# Patient Record
Sex: Male | Born: 1960 | Race: White | Hispanic: No | Marital: Single | Smoking: Never smoker
Health system: Southern US, Community
[De-identification: ages and names within clinical notes are randomized; demographics above are authoritative.]

## PROBLEM LIST (undated history)

## (undated) DIAGNOSIS — E785 Hyperlipidemia, unspecified: Secondary | ICD-10-CM

## (undated) DIAGNOSIS — K219 Gastro-esophageal reflux disease without esophagitis: Secondary | ICD-10-CM

## (undated) DIAGNOSIS — K5792 Diverticulitis of intestine, part unspecified, without perforation or abscess without bleeding: Secondary | ICD-10-CM

## (undated) DIAGNOSIS — I10 Essential (primary) hypertension: Secondary | ICD-10-CM

## (undated) HISTORY — DX: Essential (primary) hypertension: I10

## (undated) HISTORY — DX: Gastro-esophageal reflux disease without esophagitis: K21.9

## (undated) HISTORY — PX: WISDOM TOOTH EXTRACTION: SHX21

## (undated) HISTORY — DX: Diverticulitis of intestine, part unspecified, without perforation or abscess without bleeding: K57.92

## (undated) HISTORY — DX: Hyperlipidemia, unspecified: E78.5

## (undated) HISTORY — PX: OTHER SURGICAL HISTORY: SHX169

---

## 2009-04-17 ENCOUNTER — Ambulatory Visit: Payer: Self-pay | Admitting: Family Medicine

## 2009-04-17 DIAGNOSIS — E785 Hyperlipidemia, unspecified: Secondary | ICD-10-CM

## 2009-04-17 DIAGNOSIS — M2020 Hallux rigidus, unspecified foot: Secondary | ICD-10-CM | POA: Insufficient documentation

## 2009-04-17 DIAGNOSIS — E781 Pure hyperglyceridemia: Secondary | ICD-10-CM

## 2009-04-17 DIAGNOSIS — B0229 Other postherpetic nervous system involvement: Secondary | ICD-10-CM | POA: Insufficient documentation

## 2009-04-21 LAB — CONVERTED CEMR LAB
ALT: 26 units/L (ref 0–53)
Albumin: 4.3 g/dL (ref 3.5–5.2)
Alkaline Phosphatase: 48 units/L (ref 39–117)
Bilirubin, Direct: 0 mg/dL (ref 0.0–0.3)
CO2: 32 meq/L (ref 19–32)
Calcium: 9.4 mg/dL (ref 8.4–10.5)
Chloride: 107 meq/L (ref 96–112)
Direct LDL: 102.9 mg/dL
Glucose, Bld: 93 mg/dL (ref 70–99)
HDL: 35.3 mg/dL — ABNORMAL LOW (ref >39.00)
Sodium: 144 meq/L (ref 135–145)
Total Protein: 7.8 g/dL (ref 6.0–8.3)

## 2009-06-15 ENCOUNTER — Ambulatory Visit: Payer: Self-pay | Admitting: Family Medicine

## 2009-06-15 DIAGNOSIS — M533 Sacrococcygeal disorders, not elsewhere classified: Secondary | ICD-10-CM

## 2009-06-15 DIAGNOSIS — D239 Other benign neoplasm of skin, unspecified: Secondary | ICD-10-CM | POA: Insufficient documentation

## 2009-06-15 DIAGNOSIS — R1032 Left lower quadrant pain: Secondary | ICD-10-CM | POA: Insufficient documentation

## 2009-06-15 LAB — CONVERTED CEMR LAB
Bilirubin Urine: NEGATIVE
Glucose, Urine, Semiquant: NEGATIVE
Ketones, urine, test strip: NEGATIVE
Protein, U semiquant: NEGATIVE
Urobilinogen, UA: 0.2
WBC Urine, dipstick: NEGATIVE

## 2009-06-16 ENCOUNTER — Encounter: Payer: Self-pay | Admitting: Family Medicine

## 2009-06-18 ENCOUNTER — Telehealth: Payer: Self-pay | Admitting: Family Medicine

## 2009-07-09 ENCOUNTER — Ambulatory Visit: Payer: Self-pay | Admitting: Family Medicine

## 2009-07-09 DIAGNOSIS — R519 Headache, unspecified: Secondary | ICD-10-CM | POA: Insufficient documentation

## 2009-07-09 DIAGNOSIS — R51 Headache: Secondary | ICD-10-CM

## 2009-11-17 ENCOUNTER — Ambulatory Visit: Payer: Self-pay | Admitting: Internal Medicine

## 2009-11-17 DIAGNOSIS — L570 Actinic keratosis: Secondary | ICD-10-CM | POA: Insufficient documentation

## 2009-11-19 LAB — CONVERTED CEMR LAB
ALT: 27 units/L (ref 0–53)
AST: 22 units/L (ref 0–37)
Alkaline Phosphatase: 46 units/L (ref 39–117)
BUN: 14 mg/dL (ref 6–23)
Basophils Absolute: 0.1 10*3/uL (ref 0.0–0.1)
CO2: 33 meq/L — ABNORMAL HIGH (ref 19–32)
Chloride: 103 meq/L (ref 96–112)
Eosinophils Relative: 2.3 % (ref 0.0–5.0)
GFR calc non Af Amer: 69.2 mL/min (ref >60)
HCT: 47.1 % (ref 39.0–52.0)
Hemoglobin: 15.8 g/dL (ref 13.0–17.0)
Lymphocytes Relative: 44.4 % (ref 12.0–46.0)
Lymphs Abs: 2.1 10*3/uL (ref 0.7–4.0)
Monocytes Relative: 9.9 % (ref 3.0–12.0)
Neutro Abs: 2 10*3/uL (ref 1.4–7.7)
Phosphorus: 4.7 mg/dL — ABNORMAL HIGH (ref 2.3–4.6)
Potassium: 4.2 meq/L (ref 3.5–5.1)
RDW: 12.5 % (ref 11.5–14.6)
Sodium: 140 meq/L (ref 135–145)
TSH: 1.12 microintl units/mL (ref 0.35–5.50)
Total Bilirubin: 0.5 mg/dL (ref 0.3–1.2)
WBC: 4.8 10*3/uL (ref 4.5–10.5)

## 2009-11-26 ENCOUNTER — Emergency Department (HOSPITAL_COMMUNITY): Admission: EM | Admit: 2009-11-26 | Discharge: 2009-11-27 | Payer: Self-pay | Admitting: Emergency Medicine

## 2009-11-30 ENCOUNTER — Ambulatory Visit: Payer: Self-pay | Admitting: Family Medicine

## 2009-11-30 DIAGNOSIS — K219 Gastro-esophageal reflux disease without esophagitis: Secondary | ICD-10-CM

## 2009-11-30 DIAGNOSIS — M62838 Other muscle spasm: Secondary | ICD-10-CM

## 2009-12-17 ENCOUNTER — Ambulatory Visit: Payer: Self-pay | Admitting: Family Medicine

## 2010-01-11 ENCOUNTER — Ambulatory Visit: Payer: Self-pay | Admitting: Family Medicine

## 2010-03-19 ENCOUNTER — Ambulatory Visit: Payer: Self-pay | Admitting: Family Medicine

## 2010-03-26 ENCOUNTER — Ambulatory Visit: Payer: Self-pay | Admitting: Family Medicine

## 2010-03-26 DIAGNOSIS — M26629 Arthralgia of temporomandibular joint, unspecified side: Secondary | ICD-10-CM

## 2010-05-13 ENCOUNTER — Ambulatory Visit: Payer: Self-pay | Admitting: Family Medicine

## 2010-05-13 DIAGNOSIS — R110 Nausea: Secondary | ICD-10-CM | POA: Insufficient documentation

## 2010-05-14 ENCOUNTER — Encounter: Payer: Self-pay | Admitting: Family Medicine

## 2010-05-17 LAB — CONVERTED CEMR LAB
BUN: 11 mg/dL (ref 6–23)
Basophils Absolute: 0 10*3/uL (ref 0.0–0.1)
Basophils Relative: 0.9 % (ref 0.0–3.0)
Calcium: 9.6 mg/dL (ref 8.4–10.5)
Creatinine, Ser: 1.1 mg/dL (ref 0.4–1.5)
Eosinophils Absolute: 0.2 10*3/uL (ref 0.0–0.7)
GFR calc non Af Amer: 77.16 mL/min (ref >60)
Hemoglobin: 15.6 g/dL (ref 13.0–17.0)
Lipase: 46 units/L (ref 11.0–59.0)
MCHC: 35 g/dL (ref 30.0–36.0)
MCV: 91.1 fL (ref 78.0–100.0)
Monocytes Absolute: 0.5 10*3/uL (ref 0.1–1.0)
Neutro Abs: 1.7 10*3/uL (ref 1.4–7.7)
RBC: 4.89 M/uL (ref 4.22–5.81)
RDW: 13.9 % (ref 11.5–14.6)
TSH: 0.97 microintl units/mL (ref 0.35–5.50)
Total Bilirubin: 0.5 mg/dL (ref 0.3–1.2)

## 2010-05-25 ENCOUNTER — Encounter: Admission: RE | Admit: 2010-05-25 | Discharge: 2010-05-25 | Payer: Self-pay | Admitting: Family Medicine

## 2010-08-19 ENCOUNTER — Ambulatory Visit: Payer: Self-pay | Admitting: Family Medicine

## 2010-08-19 DIAGNOSIS — M5430 Sciatica, unspecified side: Secondary | ICD-10-CM

## 2010-08-19 DIAGNOSIS — K5732 Diverticulitis of large intestine without perforation or abscess without bleeding: Secondary | ICD-10-CM

## 2010-09-23 ENCOUNTER — Other Ambulatory Visit: Payer: Self-pay | Admitting: Family Medicine

## 2010-09-23 ENCOUNTER — Ambulatory Visit
Admission: RE | Admit: 2010-09-23 | Discharge: 2010-09-23 | Payer: Self-pay | Source: Home / Self Care | Attending: Family Medicine | Admitting: Family Medicine

## 2010-09-23 ENCOUNTER — Telehealth (INDEPENDENT_AMBULATORY_CARE_PROVIDER_SITE_OTHER): Payer: Self-pay | Admitting: *Deleted

## 2010-09-23 LAB — HEPATIC FUNCTION PANEL
ALT: 32 U/L (ref 0–53)
Alkaline Phosphatase: 49 U/L (ref 39–117)
Bilirubin, Direct: 0.1 mg/dL (ref 0.0–0.3)
Total Bilirubin: 0.6 mg/dL (ref 0.3–1.2)
Total Protein: 7.2 g/dL (ref 6.0–8.3)

## 2010-09-23 LAB — LIPID PANEL: VLDL: 93.2 mg/dL — ABNORMAL HIGH (ref 0.0–40.0)

## 2010-09-23 LAB — LDL CHOLESTEROL, DIRECT: Direct LDL: 95.5 mg/dL

## 2010-09-28 NOTE — Assessment & Plan Note (Signed)
Summary: F/U [facility] 11/26/09/CLE   Vital Signs:  Patient profile:   50 year old male Height:      66 inches Weight:      181.13 pounds BMI:     29.34 Temp:     98.0 degrees F oral Pulse rate:   64 / minute Pulse rhythm:   regular BP sitting:   100 / 72  (left arm) Cuff size:   regular  Vitals Entered By: Linde Gillis CMA Duncan Dull) (November 30, 2009 8:02 AM)  CC: follow-up visit from urgent care   History of Present Illness: 50 year old:  Woke up early thursday morning and was having some pain and continued to get worse, and some pain with coughing.   Had an EKG, Chest xray -- no pain with pressing and palpation. Dukes MM did not help at all. Had probably a d-dimer and cardiac enzymes that were negative.   Stopped and got a donut -- swallowing was hurtng some.   R scapular stabilizers   2. GERD, improved with prevacid, failed pepcid. better last few days  ROS: as above, no fever, chills, sweats.  GEN: WDWN, NAD, Non-toxic, A & O x 3 HEENT: Atraumatic, Normocephalic. Neck supple. No masses, No LAD. Ears and Nose: No external deformity. CV: RRR, No M/G/R. No JVD. No thrill. No extra heart sounds. PULM: CTA B, no wheezes, crackles, rhonchi. No retractions. No resp. distress. No accessory muscle use. MSK: TTP at deep scapular stabilizers EXTR: No c/c/e NEURO: Normal gait.  PSYCH: Normally interactive. Conversant. Not depressed or anxious appearing.  Calm demeanor.    Allergies: 1)  ! Levaquin   Impression & Recommendations:  Problem # 1:  MUSCLE SPASM, TRAPEZIUS MUSCLE, RIGHT (ICD-728.85) Assessment New R scap stabilizers improving reviewed basic rom and rehab  Problem # 2:  GERD (ICD-530.81) c/w prevacid  Complete Medication List: 1)  Hydrochlorothiazide 25 Mg Tabs (Hydrochlorothiazide) .... Takes 1 a day 2)  Crestor 10 Mg Tabs (Rosuvastatin calcium) .Marland Kitchen.. 1 tab at bedtime  Current Allergies (reviewed today): ! LEVAQUIN

## 2010-09-28 NOTE — Assessment & Plan Note (Signed)
Summary: 11:45 HA,NAUSEA/CLE   Vital Signs:  Patient profile:   50 year old male Weight:      180 pounds Temp:     98.5 degrees F oral Pulse rate:   72 / minute Pulse rhythm:   regular BP sitting:   110 / 80  (left arm) Cuff size:   large  Vitals Entered By: Mervin Hack CMA Duncan Dull) (November 17, 2009 12:03 PM) CC: nausea   History of Present Illness: Having nausea for about 4 days Intermittent---persists even after eating  tried ginger ale, pepto bismol---no help Very gassy--despite holding off on fish oil  No abdominal pain No vomiting no diarrhea appetite is okay No apparent fever  slight headache---dull during day (this is a chronic problem he relates to clenching teeth at night)  New lesion on left cheek also mole on right knee  Allergies: 1)  ! Levaquin  Past History:  Past medical, surgical, family and social histories (including risk factors) reviewed for relevance to current acute and chronic problems.  Past Medical History: Reviewed history from 04/17/2009 and no changes required. blood in stool diverticulitis frequent headaches GERD allergies high blood pressure readings high cholesterol migraines  Past Surgical History: Reviewed history from 04/17/2009 and no changes required. none  Family History: Reviewed history from 04/17/2009 and no changes required. Family History of Arthritis Family History Breast cancer 1st degree relative <50 Family History Hypertension Family History Lung cancer Family History of Stroke F 1st degree relative <60 Family History of Stroke M 1st degree relative <50  Social History: Reviewed history from 04/17/2009 and no changes required. Occupation: Scientist, physiological Single Never Smoked Alcohol use-yes Drug use-no Regular exercise-no  Review of Systems       No history of ulcer No black stool or blood in stool (except rarely that he relates to diverticulosis) Lives alone--gas heat. No carbon  monoxide monitor  Gets anxious in planes--travels a lot wonders about OTC meds to take uses dramamine---discussed ?meclizine  Physical Exam  General:  alert and normal appearance.   Eyes:  pupils equal, pupils round, pupils reactive to light, and no optic disk abnormalities.   Neck:  supple, no masses, no thyromegaly, and no cervical lymphadenopathy.   Lungs:  normal respiratory effort and normal breath sounds.   Heart:  normal rate, regular rhythm, no murmur, and no gallop.   Abdomen:  soft, non-tender, normal bowel sounds, no distention, and no masses.   Extremities:  no edema Neurologic:  alert & oriented X3, cranial nerves II-XII intact, strength normal in all extremities, and gait normal.   Skin:  actinic left cheek 6mm symmetric nevus on right thigh Psych:  normally interactive, good eye contact, not anxious appearing, and not depressed appearing.     Impression & Recommendations:  Problem # 1:  NAUSEA (ICD-787.02) Assessment New  unclear etiology neuro exam and GI both negative history not revealing  will get CO monitor try meclizine as needed  check labs  Orders: Venipuncture (47829) TLB-Renal Function Panel (80069-RENAL) TLB-CBC Platelet - w/Differential (85025-CBCD) TLB-Hepatic/Liver Function Pnl (80076-HEPATIC) TLB-TSH (Thyroid Stimulating Hormone) (84443-TSH) TLB-Amylase (82150-AMYL) TLB-Lipase (83690-LIPASE)  Problem # 2:  ACTINIC KERATOSIS (ICD-702.0) Assessment: New  treated 40 seconds x 2 tolerated well discussed care at home  Orders: Cryotherapy/Destruction benign or premalignant lesion (1st lesion)  (17000)  Complete Medication List: 1)  Hydrochlorothiazide 25 Mg Tabs (Hydrochlorothiazide) .... Takes 1 a day 2)  Crestor 10 Mg Tabs (Rosuvastatin calcium) .Marland Kitchen.. 1 tab at bedtime 3)  Fish  Oil Maximum Strength 1200 Mg Caps (Omega-3 fatty acids) .Marland Kitchen.. 1 tab a day  Patient Instructions: 1)  Please schedule physical with Dr Patsy Lager at your  convenience 2)  Call next week for reevaluation if the nausea persists   Current Allergies (reviewed today): ! LEVAQUIN

## 2010-09-28 NOTE — Assessment & Plan Note (Signed)
Summary: HA,NAUSEA/CLE   Vital Signs:  Patient profile:   50 year old male Height:      66 inches Weight:      187.0 pounds BMI:     30.29 Temp:     98.5 degrees F oral Pulse rate:   84 / minute Pulse rhythm:   regular BP sitting:   110 / 78  (left arm) Cuff size:   regular  Vitals Entered By: Benny Lennert CMA Duncan Dull) (May 13, 2010 9:02 AM)  History of Present Illness: Chief complaint nausea  Seen 7/22 for headaches and blurred vision         HA noted by patient.  No relationship between HA and vision change per patient.  Frontal HA, happening over last few weeks.  Lasting 1-4 hours.  B  forehead.  No F/C.  No focal neuro changes with HA.  H/o migraines in teen years.  Constant pain, not throbbing.  No vision changes during HA.  No aura with these HA.  +photo but no phonophobia.  Caffeine in soda at work.       Dr. Para March recommended...taper NSAIDS and caffeine and follow up as needed.  If any vision changes, then follow up with eye clinic.   Seen 7/29 for headache and possible TMJ     Dr. Patsy Lager referred him to dentist for mouth guard.    HAs not seen dentist yet.   TODAY.Marland KitchenMarland KitchenIntermittant nausea...x months No particular time of day...occurs more when he has not eaten.  No emesis. Not associated with headaches... No abdominal pain. No diarrhea, no constipation. Occ heartburn at night...started on pantoprazole....no further issues, made diet changes as well.  several weeks ago noted brbpr...lasted 2-3 days...has known diverticular disease.Typical for him.  Headaches...eye strain during the day...given occupational glasses...has helped with headhaches.  Last colonoscopy 8 years ago.   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 Takes 1 A Day 2)  Crestor 10 Mg Tabs (Rosuvastatin Calcium) .Marland Kitchen.. 1 Tab At Bedtime 3)  Pantoprazole Sodium 40 Mg Tbec (Pantoprazole Sodium) .Marland Kitchen.. 1 By Mouth Daily 4)  Multivitamins   Tabs (Multiple Vitamin) .... Take 1 Tablet By Mouth Once A Day  Allergies: 1)  ! Levaquin  Past History:  Past medical, surgical, family and social histories (including risk factors) reviewed, and no changes noted (except as noted below).  Past Medical History: Reviewed history from 04/17/2009 and no changes required. blood in stool diverticulitis frequent headaches GERD allergies high blood pressure readings high cholesterol migraines  Past Surgical History: Reviewed history from 04/17/2009 and no changes required. none  Family History: Reviewed history from 04/17/2009 and no changes required. Family History of Arthritis Family History Breast cancer 1st degree relative <50 Family History Hypertension Family History Lung cancer Family History of Stroke F 1st degree relative <60 Family History of Stroke  M 1st degree relative <50  Social History: Reviewed history from 03/19/2010 and no changes required. Occupation: Scientist, physiological for quality team.   Single Never Smoked Alcohol use-yes Drug use-no Regular exercise-no From [place].   Review of Systems General:  Denies fatigue and fever. CV:  Denies chest pain or discomfort. Resp:  Denies shortness of breath. GI:  Complains of bloody stools; denies constipation and  diarrhea. GU:  Denies dysuria. Neuro:  Denies difficulty with concentration, disturbances in coordination, falling down, numbness, poor balance, tingling, tremors, visual disturbances, and weakness.  Physical Exam  General:  overweight male inNAD  Mouth:  MMM Neck:  no carotid bruit or thyromegaly no cervical or supraclavicular lymphadenopathy  Lungs:  Normal respiratory effort, chest expands symmetrically. Lungs are clear to auscultation, no crackles or wheezes. Heart:  Normal rate and regular rhythm. S1 and S2 normal without gallop, murmur, click, rub or other extra sounds. Abdomen:  Bowel sounds positive,abdomen soft and non-tender without masses, organomegaly or hernias noted. Pulses:  R and L posterior tibial pulses are full and equal bilaterally  Extremities:  no edema    Impression & Recommendations:  Problem # 1:  NAUSEA (ICD-787.02) Not associated with headahce (headaches seem to be more eye strain and TMJ, less migraine like) Will eval with labs. No clear abdominal pain...possible inadequately controlled GERD/gastritis....will check Hpylori in stool. No clear infectious component. If labs negative...consider atypical presentation of gallbladder disease and consider [place] abd.  Orders: TLB-CBC Platelet - w/Differential (85025-CBCD) TLB-BMP (Basic Metabolic Panel-BMET) (80048-METABOL) TLB-Hepatic/Liver Function Pnl (80076-HEPATIC) TLB-TSH (Thyroid Stimulating Hormone) (84443-TSH) TLB-Lipase (83690-LIPASE) T- * Misc. Laboratory test [phone])  Complete Medication List: 1)  Hydrochlorothiazide 25 Mg Tabs (Hydrochlorothiazide) .... Takes 1 a day 2)  Crestor 10 Mg Tabs (Rosuvastatin calcium) .Marland Kitchen.. 1 tab at bedtime 3)  Pantoprazole Sodium 40 Mg Tbec (Pantoprazole sodium) .Marland Kitchen.. 1 by mouth daily 4)  Multivitamins Tabs (Multiple vitamin) .... Take 1 tablet by mouth once a day  Patient Instructions: 1)  Go to ER if severe abdominal pain. call if not tolerating liquids, uncontrollable  vomiting. 2)   We will call with lab results.   Current Allergies (reviewed today): ! LEVAQUIN

## 2010-09-28 NOTE — Assessment & Plan Note (Signed)
Summary: ACID REFLUX/CLE   Vital Signs:  Patient profile:   50 year old male Weight:      181 pounds Temp:     98.6 degrees F oral Pulse rate:   68 / minute Pulse rhythm:   regular BP sitting:   122 / 82  (right arm) Cuff size:   regular  Vitals Entered By: Lowella Petties CMA (Jan 11, 2010 4:02 PM) CC: Indegestion   History of Present Illness: 50 year old male:  Heartburn and pain i nthe chest difficult and ache in the jaw and  waking up at night  prop up on   taking some maalox at night.  [place]  up to three times a week.feels some heartburn sensation   Has had a EGD and colonoscopy Took some Nexium in the past  prevacid  nexium prilosec pepcid zantac   Allergies: 1)  ! Levaquin  Past History:  Past medical, surgical, family and social histories (including risk factors) reviewed, and no changes noted (except as noted below).  Past Medical History: Reviewed history from 04/17/2009 and no changes required. blood in stool diverticulitis frequent headaches GERD allergies high blood pressure readings high cholesterol migraines  Past Surgical History: Reviewed history from 04/17/2009 and no changes required. none  Family History: Reviewed history from 04/17/2009 and no changes required. Family History of Arthritis Family History Breast cancer 1st degree relative <50 Family History Hypertension Family History Lung cancer Family History of Stroke F 1st degree relative <60 Family History of Stroke M 1st degree relative <50  Social History: Reviewed history from 04/17/2009 and no changes required. 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 GI:  See HPI; Denies bloody stools, change in bowel habits, and constipation.  Physical Exam  General:  GEN: WDWN, NAD, Non-toxic, A & O x 3 HEENT: Atraumatic,  Normocephalic. Neck supple. No masses, No LAD. Ears and Nose: No external deformity. CV: RRR, No M/G/R. No JVD. No thrill. No extra heart sounds. ABD: S, NT, ND, +BS. No rebound tenderness. No HSM.  EXTR: No c/c/e NEURO: Normal gait.  PSYCH: Normally interactive. Conversant. Not depressed or anxious appearing.  Calm demeanor.     Impression & Recommendations:  Problem # 1:  GERD (ICD-530.81) Assessment Deteriorated  His updated medication list for this problem includes:    Pantoprazole Sodium 40 Mg Tbec (Pantoprazole sodium) .Marland Kitchen... 1 by mouth daily  Diagnostics Reviewed:  Discussed lifestyle modifications, diet, antacids/medications, and preventive measures.  Complete Medication List: 1)  Hydrochlorothiazide 25 Mg Tabs (Hydrochlorothiazide) .... Takes 1 a day 2)  Crestor 10 Mg Tabs (Rosuvastatin calcium) .Marland Kitchen.. 1 tab at bedtime 3)  Pantoprazole Sodium 40 Mg Tbec (Pantoprazole sodium) .Marland Kitchen.. 1 by mouth daily Prescriptions: PANTOPRAZOLE SODIUM 40 MG TBEC (PANTOPRAZOLE SODIUM) 1 by mouth daily  #30 x 11   Entered and Authorized by:   Hannah Beat MD   Signed by:   Hannah Beat MD on 01/11/2010   Method used:   [facility] to        [facility]  [address] (retail)       [address]       [place], [place]  [address]       Ph: [phone] or [phone]       Fax: [phone]   RxID:   [id number]   Prior Medications: HYDROCHLOROTHIAZIDE 25 MG TABS (HYDROCHLOROTHIAZIDE) takes 1 a day CRESTOR  10 MG TABS (ROSUVASTATIN CALCIUM) 1 tab at bedtime Current Allergies: ! LEVAQUIN

## 2010-09-28 NOTE — Assessment & Plan Note (Signed)
Summary: HEADACHES, BLURRED VISION   Vital Signs:  Patient profile:   50 year old male Height:      66 inches Weight:      188 pounds BMI:     30.45 Temp:     98.3 degrees F oral Pulse rate:   84 / minute Pulse rhythm:   regular BP sitting:   112 / 86  (left arm) Cuff size:   regular  Vitals Entered By: Delilah Shan CMA Aryaman [place] Dull) (March 19, 2010 12:23 PM) CC: Headaches with brief blurred vision on 2 occasions.   History of Present Illness: HA noted by patient.  No relationship between HA and vision change per patient.  Frontal HA, happening over last few weeks.  Lasting 1-4 hours.  B  forehead.  No F/C.  No focal neuro changes with HA.  H/o migraines in teen years.  Constant pain, not throbbing.  No vision changes during HA.  No aura with these HA.  +photo but no phonophobia.  Caffeine in soda at work.    2 episodes with blurry vision at work, lasted most of the day and then passed. No field loss per patient.  Normally wears glasses.  "My eyes hurt when I'm leaving work, late in the evening."  Sig computer time.    Occ nausea, sometimes in the middle of a meal.  Belching helps.  Has been taking frequent ibuprofen doses for back strain which is improving.     Allergies: 1)  ! Levaquin  Past History:  Past Medical History: Last updated: 04/17/2009 blood in stool diverticulitis frequent headaches GERD allergies high blood pressure readings high cholesterol migraines  Social History: Last updated: 03/19/2010 Occupation: Scientist, physiological for quality team.   Single Never Smoked Alcohol use-yes Drug use-no Regular exercise-no From [place].   Social History: Occupation: Scientist, physiological for quality team.   Single Never Smoked Alcohol use-yes Drug use-no Regular exercise-no From [place].   Review of Systems       See HPI.  Otherwise noncontributory.    Physical Exam  General:  GEN: nad, alert and oriented HEENT: mucous membranes moist,  tm wnl, PERRL, EOMI, fundus wnl bilaterally, nasal epithelium wnl, not tender to palpation on max/frontal sinuses bilaterally  NECK: supple w/o LA CV: rrr.  no murmur PULM: ctab, no inc wob ABD: soft, +bs EXT: no edema SKIN: no acute rash  CN 2-12 wnl B, S/S/DTR wnl x4, cerebellar fxn wnl bilaterally    Impression & Recommendations:  Problem # 1:  HEADACHE (ICD-784.0) Unclear source.  KGM:WNUUV rebound, migraine, chronical daily HA, caffeine, eye strain.  Reassuring exam and no focal deficit to direct imaging at this point.  I would have patient taper NSAIDS and caffeine and follow up as needed.  If any vision changes, then follow up with eye clinic.  He agrees.    Complete Medication List: 1)  Hydrochlorothiazide 25 Mg Tabs (Hydrochlorothiazide) .... Takes 1 a day 2)  Crestor 10 Mg Tabs (Rosuvastatin calcium) .Marland Kitchen.. 1 tab at bedtime 3)  Pantoprazole Sodium 40 Mg Tbec (Pantoprazole sodium) .Marland Kitchen.. 1 by mouth daily 4)  Multivitamins Tabs (Multiple vitamin) .... Take 1 tablet by mouth once a day  Patient Instructions: 1)  Work on decreasing the caffeine and ibuprofen.  Let us know if doesn't get better.  2)  Please schedule a follow-up appointment as needed .  Prescriptions: HYDROCHLOROTHIAZIDE 25 MG TABS (HYDROCHLOROTHIAZIDE) takes 1 a day  #30 x 6   Entered by:   Laurette Schimke  [place] CMA (AAMA)   Authorized by:   Crawford Givens MD   Signed by:   Delilah Shan CMA (AAMA) on 03/19/2010   Method used:   [facility]        [facility]  [address] [facility]       [address]       [place], [place]  [address]       Ph: [phone] or [phone]       Fax: [phone]   RxID:   [id number]   Current Allergies (reviewed today): ! LEVAQUIN

## 2010-09-28 NOTE — Assessment & Plan Note (Signed)
Summary: HA,JAW PAIN FROM CLENCHING TEETH WHILE SLEEPING   Vital Signs:  Patient profile:   50 year old male Height:      66 inches Weight:      188.0 pounds BMI:     30.45 Temp:     98.6 degrees F oral Pulse rate:   84 / minute Pulse rhythm:   regular BP sitting:   110 / 70  (left arm) Cuff size:   regular  Vitals Entered By: Benny Lennert CMA Duncan Dull) (March 26, 2010 8:02 AM)  History of Present Illness: Chief complaint Headache,jaw pain from clenching teeth while sleeping  50 year old male:  Jaw is really asore in the morning.  Can see her clenching   jaw pain and HA in the morning  HA noted last exam, will get off and on, thinks script may be off for glasses  GEN: Well-developed,well-nourished,in no acute distress; alert,appropriate and cooperative throughout examination HEENT: Normocephalic and atraumatic without obvious abnormalities. No apparent alopecia or balding. Ears, externally no deformities PULM: Breathing comfortably in no respiratory distress EXT: No clubbing, cyanosis, or edema PSYCH: Normally interactive. Cooperative during the interview. Pleasant. Friendly and conversant. Not anxious or depressed appearing. Normal, full affect.   Allergies: 1)  ! Levaquin   Impression & Recommendations:  Problem # 1:  TMJ PAIN (ICD-524.62) I am going to have him see Dentist to discuss, ? mouth guard  Complete Medication List: 1)  Hydrochlorothiazide 25 Mg Tabs (Hydrochlorothiazide) .... Takes 1 a day 2)  Crestor 10 Mg Tabs (Rosuvastatin calcium) .Marland Kitchen.. 1 tab at bedtime 3)  Pantoprazole Sodium 40 Mg Tbec (Pantoprazole sodium) .Marland Kitchen.. 1 by mouth daily 4)  Multivitamins Tabs (Multiple vitamin) .... Take 1 tablet by mouth once a day  Patient Instructions: 1)  DR. CLARK OR DR. Helmut Muster IN [place] [address]   Current Allergies (reviewed today): ! LEVAQUIN

## 2010-09-30 NOTE — Progress Notes (Signed)
----   Converted from flag ---- ---- 09/22/2010 5:01 PM, Hannah Beat MD wrote: FLP: 272.4 HFP: v58.69 PSA: v76.44  ---- 09/22/2010 4:04 PM, Mills Koller wrote: patient has a lab appt for tomorrow, what labs w/ dx, please. Terri ------------------------------

## 2010-09-30 NOTE — Assessment & Plan Note (Signed)
Summary: LOW BACK PAIN,LEFT LEG/CLE   Vital Signs:  Patient profile:   50 year old male Height:      66 inches Weight:      192.50 pounds BMI:     31.18 Temp:     98 degrees F oral Pulse rate:   80 / minute Pulse rhythm:   regular BP sitting:   112 / 74  (left arm) Cuff size:   regular  Vitals Entered By: Benny Lennert CMA Duncan Dull) (August 19, 2010 3:12 PM)  History of Present Illness: Chief complaint low back pain and left leg pain  50 year old male:  Abdominal pain when haivng a bowel movement. LLQ pain. Right in the beltline.   Back has bene oingoing for the past couple of days. Has had some issues with back for about ten years or more, now with radiculaopthy on the left side.         This is a 50 year old male who presents with diverticular disease.  The patient complains of abdominal pain, but denies nausea, vomiting, diarrhea, constipation, jaundice, melena, and hematochezia.  The patient notes diverticulosis.  Location: descending.  Previous management for this problem includes medical treatment as outpatient.    Back Pain History:      The patient's back pain started approximately 08/17/2010.  The pain is located in the lower back region and does radiate below the knees.  He states that he has had a prior history of back pain.  The patient has not had any recent physical therapy for his back pain.    Critical Exclusionary Diagnosis Criteria (CEDC) for Back Pain:      The patient denies a history of previous trauma.  He has no prior history of spinal surgery.  There are no symptoms to suggest infection, cancer, cauda equina, or psychosocial factors for back pain.    Allergies: 1)  ! Levaquin  Past History:  Past medical, surgical, family and social histories (including risk factors) reviewed, and no changes noted (except as noted below).  Past Medical History: Reviewed history from 04/17/2009 and no changes required. blood in stool diverticulitis frequent  headaches GERD allergies high blood pressure readings high cholesterol migraines  Past Surgical History: Reviewed history from 05/26/2010 and no changes required. none  u/s, 04/2010 -prob hemangioma,  repeat, 6 mo needed (10/2010)  Family History: Reviewed history from 04/17/2009 and no changes required. Family History of Arthritis Family History Breast cancer 1st degree relative <50 Family History Hypertension Family History Lung cancer Family History of Stroke F 1st degree relative <60 Family History of Stroke M 1st degree relative <50  Social History: Reviewed history from 03/19/2010 and no changes required. Occupation: Scientist, physiological for quality team.   Single Never Smoked Alcohol use-yes Drug use-no Regular exercise-no From [place].   Review of Systems       REVIEW OF SYSTEMS GEN: Acute illness details above. CV: No chest pain or SOB GI: above Otherwise, pertinent positives and negatives are noted in the HPI.   Physical Exam  General:  GEN: WDWN, NAD, Non-toxic, A & O x 3 HEENT: Atraumatic, Normocephalic. Neck supple. No masses, No LAD. Ears and Nose: No external deformity. CV: RRR, No M/G/R. No JVD. No thrill. No extra heart sounds. PULM: CTA B, no wheezes, crackles, rhonchi. No retractions. No resp. distress. No accessory muscle use. ABD: S, + LLQ abd tenderness, ND, +BS. No rebound tenderness. No HSM.  EXTR: No c/c/e NEURO: Normal gait.  PSYCH:  Normally interactive. Conversant. Not depressed or anxious appearing.  Calm demeanor.    Low Back Pain Physical Exam:    Inspection-deformity:     No    Palpation-spinal tenderness:   No    Motor Exam/Strength:         Left Ankle Dorsiflexion (L5,L4):     normal       Left Great Toe Dorsiflexion (L5,L4):     normal       Left Heel Walk (L5,some L4):     normal       Left Single Squat & Rise-Quads (L4):   normal       Left Toe Walk-calf (S1):       normal       Right Ankle Dorsiflexion (L5,L4):      normal       Right Great Toe Dorsiflexion (L5,L4):       normal       Right Heel Walk (L5,some L4):     normal       Right Single Squat & Rise Quads (L4):   normal       Right Toe Walk-calf (S1):       normal    Sensory Exam/Pinprick:        Left Medial Foot (L4):   normal       Left Dorsal Foot (L5):   normal       Left Lateral Foot (S1):   normal       Right Medial Foot (L4):   normal       Right Dorsal Foot (L5):   normal       Right Lateral Foot (S1):   normal    Reflexes:        Left Knee Jerk (L4):     normal       Left Ankle Reflex (S1):   normal       Right Knee Jerk:     normal       Right Ankle Reflex (S1):   normal    Straight Leg Raise (SLR):       Left Straight Leg Raise (SLR):   positive at 40 degrees       Right Straight Leg Raise (SLR):   negative   Impression & Recommendations:  Problem # 1:  DIVERTICULITIS, ACUTE (ICD-562.11) Assessment New  augmentin  allergy to lvq flagyl causes nausea  Labs Reviewed: Hgb: 15.6 (05/13/2010)   Hct: 44.5 (05/13/2010)   WBC: 5.1 (05/13/2010)  Problem # 2:  SCIATICA, LEFT (ICD-724.3)  Also given a handout with more extensive Piriformis stretching, hip flexor and abductor strengthening, ham stretching  Rec deep massage, explained self-massage with ball   His updated medication list for this problem includes:    Hydrocodone-acetaminophen 5-500 Mg Tabs (Hydrocodone-acetaminophen) .Marland Kitchen... 1 tab by mouth q 6 hours as needed pain  Complete Medication List: 1)  Hydrochlorothiazide 25 Mg Tabs (Hydrochlorothiazide) .... Takes 1 a day 2)  Crestor 10 Mg Tabs (Rosuvastatin calcium) .Marland Kitchen.. 1 tab at bedtime 3)  Pantoprazole Sodium 40 Mg Tbec (Pantoprazole sodium) .Marland Kitchen.. 1 by mouth daily 4)  Multivitamins Tabs (Multiple vitamin) .... Take 1 tablet by mouth once a day 5)  Hydrocodone-acetaminophen 5-500 Mg Tabs (Hydrocodone-acetaminophen) .Marland Kitchen.. 1 tab by mouth q 6 hours as needed pain 6)  Amoxicillin-pot Clavulanate 875-125 Mg Tabs  (Amoxicillin-pot clavulanate) .Marland Kitchen.. 1 by mouth two times a day Prescriptions: AMOXICILLIN-POT CLAVULANATE 875-125 MG TABS (AMOXICILLIN-POT CLAVULANATE) 1 by mouth two times a day  #20  x 0   Entered and Authorized by:   Hannah Beat MD   Signed by:   Hannah Beat MD on 08/19/2010   Method used:   Print then Give to Patient   RxID:   [id number] HYDROCODONE-ACETAMINOPHEN 5-500 MG TABS (HYDROCODONE-ACETAMINOPHEN) 1 tab by mouth q 6 hours as needed pain  #30 x 0   Entered and Authorized by:   Hannah Beat MD   Signed by:   Hannah Beat MD on 08/19/2010   Method used:   Print then Give to Patient   RxID:   [id number] CRESTOR 10 MG TABS (ROSUVASTATIN CALCIUM) 1 tab at bedtime  #30 x 3   Entered by:   Benny Lennert CMA (AAMA)   Authorized by:   Hannah Beat MD   Signed by:   Hannah Beat MD on 08/19/2010   Method used:   Electronically to        [facility] [id number] (retail[facility]       [address]       [place], [place]  [address]       Ph: [phone] or [phone]       Fax: [phone]   RxID:   [id number]    Orders Added: 1)  Est. Patient Level IV [60109]    Current Allergies (reviewed today): ! LEVAQUIN

## 2010-11-13 ENCOUNTER — Encounter: Payer: Self-pay | Admitting: Family Medicine

## 2010-11-17 LAB — COMPREHENSIVE METABOLIC PANEL
Albumin: 4.3 g/dL (ref 3.5–5.2)
Alkaline Phosphatase: 53 U/L (ref 39–117)
BUN: 12 mg/dL (ref 6–23)
GFR calc Af Amer: >60 mL/min (ref >60)
Potassium: 4.3 mEq/L (ref 3.5–5.1)
Total Protein: 7.7 g/dL (ref 6.0–8.3)

## 2010-11-17 LAB — CBC
Hemoglobin: 15.5 g/dL (ref 13.0–17.0)
RBC: 4.97 MIL/uL (ref 4.22–5.81)
RDW: 12.7 % (ref 11.5–15.5)
WBC: 8.6 10*3/uL (ref 4.0–10.5)

## 2010-11-17 LAB — DIFFERENTIAL
Basophils Relative: 0 % (ref 0–1)
Lymphocytes Relative: 39 % (ref 12–46)
Monocytes Absolute: 0.7 10*3/uL (ref 0.1–1.0)
Monocytes Relative: 9 % (ref 3–12)
Neutro Abs: 4.4 10*3/uL (ref 1.7–7.7)

## 2010-11-17 LAB — CK TOTAL AND CKMB (NOT AT ARMC)
CK, MB: 0.7 ng/mL (ref 0.3–4.0)
Relative Index: 0.6 (ref 0.0–2.5)

## 2010-11-18 ENCOUNTER — Ambulatory Visit: Payer: Self-pay | Admitting: Family Medicine

## 2010-11-29 ENCOUNTER — Ambulatory Visit (INDEPENDENT_AMBULATORY_CARE_PROVIDER_SITE_OTHER): Payer: Self-pay | Admitting: Family Medicine

## 2010-11-29 ENCOUNTER — Encounter: Payer: Self-pay | Admitting: Family Medicine

## 2010-11-29 VITALS — BP 118/78 | HR 60 | Temp 98.9°F | Ht 66.0 in | Wt 180.4 lb

## 2010-11-29 DIAGNOSIS — R51 Headache: Secondary | ICD-10-CM

## 2010-11-29 DIAGNOSIS — G43009 Migraine without aura, not intractable, without status migrainosus: Secondary | ICD-10-CM

## 2010-11-29 MED ORDER — SUMATRIPTAN SUCCINATE 50 MG PO TABS: 50.0000 mg | ORAL_TABLET | ORAL | Status: DC | PRN

## 2010-11-29 MED ORDER — PROPRANOLOL HCL ER 80 MG PO CP24: 80.0000 mg | ORAL_CAPSULE | Freq: Every day | ORAL | Status: DC

## 2010-11-29 MED ORDER — OMEGA-3-ACID ETHYL ESTERS 1 G PO CAPS: 2.0000 g | ORAL_CAPSULE | Freq: Two times a day (BID) | ORAL | Status: DC

## 2010-11-29 NOTE — Patient Instructions (Signed)
Follow-up 2-3 months for full physical STOP TAKING THE EXCEDRIN   Migraine Headache A migraine headache is an intense, throbbing pain on one or both sides of your head. The exact cause of a migraine headache is not always known. A migraine may be caused when nerves in the brain become irritated and release chemicals that cause swelling (inflammation) within blood vessels, causing pain. Many migraine sufferers have a family history of migraines. Before you get a migraine you may or may not get an aura. An aura is a group of symptoms that can predict the beginning of a migraine. An aura may include:  Visual changes such as:   Flashing lights.   Seeing bright spots or zig-zag lines.   Tunnel vision.   Feelings of numbness.   Trouble talking.   Muscle weakness.  SYMPTOMS OF A MIGRAINE  A migraine headache has one or more of the following symptoms:  Pain on one or both sides of your head.   Pain that is pulsating or throbbing in nature.   Pain that is severe enough to prevent daily activities.   Pain that is aggravated by any daily physical activity.   Nausea (feeling sick to your stomach), vomiting or both.   Pain with exposure to bright lights, loud noises or activity.   General sensitivity to bright lights or loud noises.  MIGRAINE TRIGGERS A migraine headache can be triggered by many things. Examples of triggers include:   Alcohol.   Smoking.   Stress.   It may be related to menses (male menstruation).   Aged cheeses.   Foods or drinks that contain nitrates, glutamate, aspartame or tyramine.   Lack of sleep.   Chocolate.   Caffeine.   Hunger.   Medications such as nitroglycerine (used to treat chest pain), birth control pills, estrogen and some blood pressure medications.  DIAGNOSIS  A migraine headache is often diagnosed based on:   Your symptoms.   Physical examination.   A CT scan of your head may be ordered to see if your headaches are caused  from other medical conditions.  HOME CARE INSTRUCTIONS  Medications can help prevent migraines if they are recurrent or should they become recurrent. Your caregiver can help you with a medication or treatment program that will be helpful to you.   If you get a migraine, it may be helpful to lie down in a dark, quiet room.   It may be helpful to keep a headache diary. This may help you find a trend as to what may be triggering your headaches.  SEEK IMMEDIATE MEDICAL CARE IF:   You do not get relief from the medications given to you or you have a recurrence of pain.   You have confusion, personality changes or seizures.   You have headaches that wake you from sleep.   You have an increased frequency in your headaches.   You have a stiff neck.   You have a loss of vision.   You have muscle weakness.   You start losing your balance or have trouble walking.   You feel faint or pass out.  MAKE SURE YOU:   Understand these instructions.   Will watch your condition.   Will get help right away if you are not doing well or get worse.  Document Released: 08/15/2005 Document Re-Released: 06/12/2009 [facility] Patient Information 2011 [place], [place].

## 2010-11-29 NOTE — Progress Notes (Signed)
50 year old male with h/o headaches:  Worsening:  About six months ago, was having some nagging headaches  Over the last few weeks, have been getting worse, preparing for it ahead of time Two weeks ago, was going to go to [place] Also had some nausea at the time Took some vicodin tid  Also had some hives for a few days  Had some bad hives  Did have some problems with light and sound. At least those last couple of days.    Subjective:    Manuel Jacobson is a 50 y.o. male who presents for follow-up of migraine headaches and rebound headaches due to excessive medication use. Home treatment has included anaprox and Tylenol with codeine with little improvement. Headaches are occurring every day. Generally, the headaches last about several hours. Work attendance or other daily activities are not affected by the headaches. The patient denies decreased physical activity, depression, dizziness, loss of balance, muscle weakness, numbness of extremities, speech difficulties, vision problems, vomiting in the early morning and worsening school/work performance.  The PMH, PSH, Social History, Family History, Medications, and allergies have been reviewed in [facility], and have been updated if relevant.  Review of Systems Pertinent items are noted in HPI.   ROS: GEN: No acute illnesses, no fevers, chills. GI: No n/v/d, eating normally Pulm: No SOB Neuro as above Interactive and getting along well at home.  Otherwise, ROS is as per the HPI.    Objective:   GEN: WDWN, NAD, Non-toxic, A & O x 3 HEENT: Atraumatic, Normocephalic. Neck supple. No masses, No LAD. Ears and Nose: No external deformity. CV: RRR, No M/G/R. No JVD. No thrill. No extra heart sounds. PULM: CTA B, no wheezes, crackles, rhonchi. No retractions. No resp. distress. No accessory muscle use. ABD: S, NT, ND, +BS. No rebound tenderness. No HSM.  EXTR: No c/c/e Neuro: CN 2-12 grossly intact. PERRLA. EOMI. Sensation intact throughout. Str  5/5 all extremities. DTR 2+. No clonus. A and o x 4. Romberg neg. Finger nose neg. Heel -shin neg.  PSYCH: Normally interactive. Conversant. Not depressed or anxious appearing.  Calm demeanor.     Assessment:    Analgesic rebound headache  Migraine HA   Plan:   1. D/c excedrin 2. Suspect primary migraine, start inderral, imitrex prn 3. F/u 2 mo for CPX 4. Chol, start lovaza, high trigs.

## 2010-12-14 ENCOUNTER — Other Ambulatory Visit: Payer: Self-pay | Admitting: *Deleted

## 2010-12-14 MED ORDER — HYDROCHLOROTHIAZIDE 25 MG PO TABS: 25.0000 mg | ORAL_TABLET | Freq: Every day | ORAL | Status: DC

## 2010-12-24 ENCOUNTER — Other Ambulatory Visit: Payer: Self-pay | Admitting: Family Medicine

## 2011-02-15 ENCOUNTER — Ambulatory Visit (INDEPENDENT_AMBULATORY_CARE_PROVIDER_SITE_OTHER): Payer: 59 | Admitting: Family Medicine

## 2011-02-15 ENCOUNTER — Encounter: Payer: Self-pay | Admitting: Family Medicine

## 2011-02-15 DIAGNOSIS — R1310 Dysphagia, unspecified: Secondary | ICD-10-CM | POA: Insufficient documentation

## 2011-02-15 DIAGNOSIS — R61 Generalized hyperhidrosis: Secondary | ICD-10-CM

## 2011-02-15 DIAGNOSIS — IMO0001 Reserved for inherently not codable concepts without codable children: Secondary | ICD-10-CM

## 2011-02-15 DIAGNOSIS — R1011 Right upper quadrant pain: Secondary | ICD-10-CM | POA: Insufficient documentation

## 2011-02-15 LAB — CBC WITH DIFFERENTIAL/PLATELET
Basophils Relative: 0.7 % (ref 0.0–3.0)
Eosinophils Relative: 2 % (ref 0.0–5.0)
HCT: 47 % (ref 39.0–52.0)
Lymphs Abs: 3.5 10*3/uL (ref 0.7–4.0)
MCV: 95.1 fl (ref 78.0–100.0)
Monocytes Absolute: 0.5 10*3/uL (ref 0.1–1.0)
Neutro Abs: 2.4 10*3/uL (ref 1.4–7.7)
RBC: 4.94 Mil/uL (ref 4.22–5.81)
WBC: 6.7 10*3/uL (ref 4.5–10.5)

## 2011-02-15 LAB — BASIC METABOLIC PANEL
BUN: 14 mg/dL (ref 6–23)
Calcium: 9.8 mg/dL (ref 8.4–10.5)
GFR: 73.03 mL/min (ref >60.00)
Glucose, Bld: 92 mg/dL (ref 70–99)

## 2011-02-15 LAB — LIPASE: Lipase: 31 U/L (ref 11.0–59.0)

## 2011-02-15 LAB — HEPATIC FUNCTION PANEL: Albumin: 4.3 g/dL (ref 3.5–5.2)

## 2011-02-15 MED ORDER — AMITRIPTYLINE HCL 50 MG PO TABS: 50.0000 mg | ORAL_TABLET | Freq: Every day | ORAL | Status: DC

## 2011-02-15 MED ORDER — OMEPRAZOLE-SODIUM BICARBONATE 40-1100 MG PO CAPS: 1.0000 | ORAL_CAPSULE | Freq: Every day | ORAL | Status: AC

## 2011-02-15 NOTE — Progress Notes (Signed)
Manuel Jacobson, a 50 y.o. male presents today in the office for the following:    Abd pain, flank pain, sweating: Last few weeks, will feel warm and sweating. Coincides some with this pain. Mowed the lawn, hair was wet, sweating and feeling hot all the time. Pain on the right side, and will not be able to feel comfortable. No n or diarrhea, some nausea. Right lateral below ribs.  Some in epigastric pain. Had GERD in the past, not on any meds now.  Dysphagia, Odynophagia: Also, noticed, that is throat is having something that will be stuck and things will not go down. Also will have some pain with swallowing.  Raspy and hoarse all the time.   Migraines: Headaches have been better. Medication seems like it may be very fatigued and also light headed. On Inderal.  Patient Active Problem List  Diagnoses  . POSTHERPETIC NEURALGIA  . HYPERTRIGLYCERIDEMIA  . HYPERLIPIDEMIA  . GERD  . HALLUX RIGIDUS  . HEADACHE  . DIVERTICULITIS, ACUTE  . SCIATICA, LEFT  . Migraine headache without aura  . Dysphagia  . Odynophagia  . Right upper quadrant pain   Past Medical History  Diagnosis Date  . Diverticulitis   . GERD (gastroesophageal reflux disease)   . Allergic rhinitis due to pollen   . Hypertension   . Hyperlipidemia   . Migraine    No past surgical history on file. History  Substance Use Topics  . Smoking status: Never Smoker   . Smokeless tobacco: Not on file  . Alcohol Use: Yes     weekends   No family history on file. Allergies  Allergen Reactions  . Levofloxacin    Current Outpatient Prescriptions on File Prior to Visit  Medication Sig Dispense Refill  . CRESTOR 10 MG tablet TAKE 1 TABLET BY MOUTH AT BEDTIME  30 tablet  3  . hydrochlorothiazide 25 MG tablet Take 1 tablet (25 mg total) by mouth daily.  30 tablet  6  . Multiple Vitamin (MULTIVITAMIN) tablet Take 1 tablet by mouth daily.        Marland Kitchen omega-3 acid ethyl esters (LOVAZA) 1 G capsule Take 2 capsules (2 g total) by  mouth 2 (two) times daily.  120 capsule  11  . SUMAtriptan (IMITREX) 50 MG tablet Take 1 tablet (50 mg total) by mouth every 2 (two) hours as needed for migraine.  10 tablet  1  . DISCONTD: propranolol (INDERAL LA) 80 MG 24 hr capsule Take 1 capsule (80 mg total) by mouth daily.  30 capsule  11  . HYDROcodone-acetaminophen (VICODIN) 5-500 MG per tablet Take 1 tablet by mouth every 6 (six) hours as needed.        . Omeprazole Magnesium (PRILOSEC OTC PO) Take by mouth daily as needed.         ROS: GEN: above GI: above Pulm: No SOB Interactive and getting along well at home.  Otherwise, ROS is as per the HPI.   Physical Exam  Blood pressure 120/84, pulse 69, temperature 98.4 F (36.9 C), temperature source Oral, height 5\' 6"  (1.676 m), weight 185 lb 1.9 oz (83.97 kg), SpO2 96.00%.  GEN: WDWN, NAD, Non-toxic, A & O x 3. Pale, slightly sweating. HEENT: Atraumatic, Normocephalic. Neck supple. No masses, No LAD. Ears and Nose: No external deformity. CV: RRR, No M/G/R. No JVD. No thrill. No extra heart sounds. PULM: CTA B, no wheezes, crackles, rhonchi. No retractions. No resp. distress. No accessory muscle use. ABD: S, ND, + moderate  tenderness epigastric and RUQ region. Negative murphy's sign. No rebound tenderness. No HSM.  EXTR: No c/c/e NEURO Normal gait.  PSYCH: Normally interactive. Conversant. Not depressed or anxious appearing.  Calm demeanor.   A/P: 1. Abd pain, etiology unclear. Check labs below. Check Abd u/s to eval gallbladder, liver, r/o cholelithiasis and gallstones. 2. Dysphagia and odynophagia, possible stricture now -- will address, start PPI, but take care of #1 more acutely. 3. Migraine, d/c inderal and start elavil proph

## 2011-02-15 NOTE — Patient Instructions (Signed)
REFERRAL: GO THE THE FRONT ROOM AT THE ENTRANCE OF OUR CLINIC, NEAR CHECK IN. ASK FOR Manuel Jacobson. SHE WILL HELP YOU SET UP YOUR REFERRAL. DATE: TIME:  

## 2011-02-17 ENCOUNTER — Ambulatory Visit
Admission: RE | Admit: 2011-02-17 | Discharge: 2011-02-17 | Disposition: A | Discharge status: Home / Self Care | Payer: 59 | Source: Ambulatory Visit | Attending: Family Medicine | Admitting: Family Medicine

## 2011-02-17 ENCOUNTER — Encounter: Payer: Self-pay | Admitting: Gastroenterology

## 2011-02-17 ENCOUNTER — Other Ambulatory Visit: Payer: Self-pay | Admitting: Family Medicine

## 2011-02-17 DIAGNOSIS — R1011 Right upper quadrant pain: Secondary | ICD-10-CM

## 2011-02-17 DIAGNOSIS — R1310 Dysphagia, unspecified: Secondary | ICD-10-CM

## 2011-04-01 ENCOUNTER — Ambulatory Visit (INDEPENDENT_AMBULATORY_CARE_PROVIDER_SITE_OTHER): Payer: 59 | Admitting: Gastroenterology

## 2011-04-01 ENCOUNTER — Encounter: Payer: Self-pay | Admitting: Gastroenterology

## 2011-04-01 VITALS — BP 100/70 | HR 60 | Ht 66.0 in | Wt 189.0 lb

## 2011-04-01 DIAGNOSIS — K5730 Diverticulosis of large intestine without perforation or abscess without bleeding: Secondary | ICD-10-CM

## 2011-04-01 DIAGNOSIS — R109 Unspecified abdominal pain: Secondary | ICD-10-CM

## 2011-04-01 MED ORDER — PEG-KCL-NACL-NASULF-NA ASC-C 100 G PO SOLR: 1.0000 | ORAL | Status: DC

## 2011-04-01 NOTE — Patient Instructions (Addendum)
You will be set up for a colonoscopy for history of diverticulitis. You will be set up for an upper endoscopy for right sided pains, after eating. You need to call Dr. Christella Hartigan' with repeat "diverticulitis pains."  Would likely set up CT scan abd/pelvis with IV and PO contrast. A copy of this information will be made available to Dr. Patsy Lager. Start DAILY PPI (prilosec, prevacid, pantoprazole, OCT for now). Try [employer] online delivery service.

## 2011-04-01 NOTE — Progress Notes (Signed)
HPI: This is a  very pleasant 50 year old man who [place]  Was diagnosed with diverticulosis 17 years ago. He has bouts of "itis" 2-3 times a day:  Lower abdominal pain, can last several days, improves after 2-3 days of po antibiotics.  Cirpo/flagyl or amox.  Had sigmoidoscopy 17 years ago.  Colonoscopy 2001 and EGD 2001: tics, hiatal hernia.  In [place].  No CT scan that he is aware of.    Currently right sided, right flank pains. This pain has been going on for 3 months.  Piercing pain, more often after he eats.  GEtting up to move around helps it.  Certain positions make it better.  The pain is constant but eating, certain positions make it very noticible.    Takes 2  Full strenght a day for headaches that occur about daily.    Intermittent, rare rectal bleeding.  Eats a lot of fiber.  No signficant consipation or diarrhea.  Gets pyrosis, rare. OTC prilosec is very helpful.  Overall stable weight.     Review of systems: Pertinent positive and negative review of systems were noted in the above HPI section.  All other review of systems was otherwise negative.   Past Medical History  Diagnosis Date  . Diverticulitis   . GERD (gastroesophageal reflux disease)   . Allergic rhinitis due to pollen   . Hypertension   . Hyperlipidemia   . Migraine     Past Surgical History  Procedure Date  . Other surgical history     cyst removed from left heel  . Wisdom tooth extraction      reports that he has never smoked. He has never used smokeless tobacco. He reports that he drinks alcohol. He reports that he does not use illicit drugs.  family history includes Arthritis in his father; Breast cancer in his maternal grandmother; Hyperlipidemia in his father; Hypertension in his father; Lung cancer in his mother; and Stroke in his mother.    Current Medications, Allergies were all reviewed with the patient via [facility] electronic medical record  system.    Physical Exam: BP 100/70  Pulse 60  Ht 5\' 6"  (1.676 m)  Wt 189 lb (85.73 kg)  BMI 30.51 kg/m2 Constitutional: generally well-appearing Psychiatric: alert and oriented x3 Eyes: extraocular movements intact Mouth: oral pharynx moist, no lesions Neck: supple no lymphadenopathy Cardiovascular: heart regular rate and rhythm Lungs: clear to auscultation bilaterally Abdomen: soft, nontender, nondistended, no obvious ascites, no peritoneal signs, normal bowel sounds Extremities: no lower extremity edema bilaterally Skin: no lesions on visible extremities    Assessment and plan: 50 y.o. male with history of "diverticulitis", right-sided abdominal pains  First I think we should try to document whether he is really having recurrent diverticulitis. I do think that he probably is. He knows to call here or the next time he has those lower abdominal discomforts and we'll set him up with urgent CT scan as well as starting him on antibiotics. If he is indeed having repeat episodes of diverticulitis then he will need surgical referral to consider segmental colectomy. We'll proceed with full colonoscopy as well. His right-sided abdominal pains seem more flank pains to me. Unclear etiology and I did not mention above but he did have an abdominal ultrasound recently and was normal. Liver tests were normal recently as well. Perhaps his pains are peptic related, he takes 2 aspirin every day. For now he will start taking daily proton  pump inhibitor and we will proceed with EGD at the same time as his colonoscopy.

## 2011-04-08 ENCOUNTER — Encounter: Payer: 59 | Admitting: Gastroenterology

## 2011-04-24 ENCOUNTER — Other Ambulatory Visit: Payer: Self-pay | Admitting: Family Medicine

## 2011-05-10 ENCOUNTER — Ambulatory Visit (AMBULATORY_SURGERY_CENTER): Payer: 59 | Admitting: Gastroenterology

## 2011-05-10 ENCOUNTER — Encounter: Payer: Self-pay | Admitting: Gastroenterology

## 2011-05-10 DIAGNOSIS — K5730 Diverticulosis of large intestine without perforation or abscess without bleeding: Secondary | ICD-10-CM

## 2011-05-10 DIAGNOSIS — K2960 Other gastritis without bleeding: Secondary | ICD-10-CM

## 2011-05-10 DIAGNOSIS — K2970 Gastritis, unspecified, without bleeding: Secondary | ICD-10-CM

## 2011-05-10 DIAGNOSIS — R109 Unspecified abdominal pain: Secondary | ICD-10-CM

## 2011-05-10 DIAGNOSIS — D126 Benign neoplasm of colon, unspecified: Secondary | ICD-10-CM

## 2011-05-10 DIAGNOSIS — K2940 Chronic atrophic gastritis without bleeding: Secondary | ICD-10-CM

## 2011-05-10 MED ORDER — SODIUM CHLORIDE 0.9 % IV SOLN: 500.0000 mL | INTRAVENOUS | Status: DC

## 2011-05-10 NOTE — Patient Instructions (Signed)
Please review discharge instructions (blue and green sheets)  Please review information given about polyps, gastritis, high fiber diets, and diverticulosis  Please call Dr. Christella Hartigan' office is you suspect you are having recurrent diverticulitis

## 2011-05-10 NOTE — Progress Notes (Signed)
Pt's sats 88-90% when he falls asleep, snoring.  When pt awakened, they go up to 92-96%.

## 2011-05-11 ENCOUNTER — Telehealth: Payer: Self-pay | Admitting: *Deleted

## 2011-05-11 NOTE — Telephone Encounter (Signed)

## 2011-06-23 ENCOUNTER — Other Ambulatory Visit: Payer: Self-pay | Admitting: Family Medicine

## 2011-06-24 ENCOUNTER — Encounter: Payer: Self-pay | Admitting: *Deleted

## 2011-08-31 ENCOUNTER — Telehealth: Payer: Self-pay | Admitting: Family Medicine

## 2011-08-31 ENCOUNTER — Encounter: Payer: Self-pay | Admitting: Family Medicine

## 2011-08-31 ENCOUNTER — Telehealth: Payer: Self-pay | Admitting: Gastroenterology

## 2011-08-31 ENCOUNTER — Ambulatory Visit (INDEPENDENT_AMBULATORY_CARE_PROVIDER_SITE_OTHER): Payer: 59 | Admitting: Family Medicine

## 2011-08-31 ENCOUNTER — Other Ambulatory Visit: Payer: Self-pay | Admitting: Family Medicine

## 2011-08-31 VITALS — BP 100/70 | HR 60 | Temp 98.6°F | Ht 66.0 in | Wt 169.0 lb

## 2011-08-31 DIAGNOSIS — K5792 Diverticulitis of intestine, part unspecified, without perforation or abscess without bleeding: Secondary | ICD-10-CM

## 2011-08-31 DIAGNOSIS — K5732 Diverticulitis of large intestine without perforation or abscess without bleeding: Secondary | ICD-10-CM

## 2011-08-31 MED ORDER — AMOXICILLIN-POT CLAVULANATE 875-125 MG PO TABS: 1.0000 | ORAL_TABLET | Freq: Two times a day (BID) | ORAL | Status: AC

## 2011-08-31 NOTE — Telephone Encounter (Signed)
Patient advised and add to 1200

## 2011-08-31 NOTE — Progress Notes (Signed)
  Patient Name: Manuel Jacobson Date of Birth: 02-Jan-1961 Age: 51 y.o. Medical Record Number: [id number] Gender: male Date of Encounter: 08/31/2011  History of Present Illness:  Manuel Jacobson is a 51 y.o. very pleasant male patient who presents with the following:  Pleasant patient with confirmed diverticulosis on colonoscopy and multiple events of diverticulitis in the past who presents with 2 day history of abdominal pain in B LQ.   Not much of an appetite this morning. Some subj fever and chills yesterday. Was able to eat chicken soup.  No diarrhea, n/v. No blood in stool.  No flu or other acute symptoms  Past Medical History, Surgical History, Social History, Family History, Problem List, Medications, and Allergies have been reviewed and updated if relevant.  Review of Systems: ROS: GEN: Acute illness details above GI: Tolerating PO intake but decreased GU: maintaining adequate hydration and urination Pulm: No SOB Interactive and getting along well at home.  Otherwise, ROS is as per the HPI.   Physical Examination: Filed Vitals:   08/31/11 1153  BP: 100/70  Pulse: 60  Temp: 98.6 F (37 C)  TempSrc: Oral  Height: 5\' 6"  (1.676 m)  Weight: 169 lb (76.658 kg)  SpO2: 98%    Body mass index is 27.28 kg/(m^2).   GEN: WDWN, NAD, Non-toxic, A & O x 3 HEENT: Atraumatic, Normocephalic. Neck supple. No masses, No LAD. Ears and Nose: No external deformity. CV: RRR, No M/G/R. No JVD. No thrill. No extra heart sounds. ABD: S, mod tenderness B LQ, no rebound. No HSM. Nondistended. PULM: CTA B, no wheezes, crackles, rhonchi. No retractions. No resp. distress. No accessory muscle use. EXTR: No c/c/e NEURO Normal gait.  PSYCH: Normally interactive. Conversant. Not depressed or anxious appearing.  Calm demeanor.    Assessment and Plan: 1. Diverticulitis  amoxicillin-clavulanate (AUGMENTIN) 875-125 MG per tablet  2. DIVERTICULITIS, ACUTE      Exam and history most c/w  above.  Precautions given -- if worsens needs an acute evaluation or ER if worsens in the middle of the night. The patient may follow-up with Dr. Christella Hartigan tomorrow

## 2011-08-31 NOTE — Telephone Encounter (Signed)
Pt having symptoms of diverticulitis, pain in lower right side abd, pain when sitting.  Pt would like to be seen today or call back to advise.  Patient can be called at [phone].

## 2011-08-31 NOTE — Telephone Encounter (Signed)
Add him on at 12 noon

## 2011-08-31 NOTE — Telephone Encounter (Signed)
Patient c/o lower abdominal pain.  He reports pain with BM and feels like other flares of diverticulitis.  He wants to be seen today.  I have given him an appt for tomorrow at 2:00 with Willette Cluster RNP, he is going to try and schedule an appt with his primary care today.  He will call back if he doesn't need this appt.

## 2011-08-31 NOTE — Telephone Encounter (Signed)
What do we need to do urgent care or add him on?

## 2011-09-01 ENCOUNTER — Encounter: Payer: Self-pay | Admitting: Physician Assistant

## 2011-09-01 ENCOUNTER — Ambulatory Visit (INDEPENDENT_AMBULATORY_CARE_PROVIDER_SITE_OTHER): Payer: 59 | Admitting: Physician Assistant

## 2011-09-01 ENCOUNTER — Ambulatory Visit (INDEPENDENT_AMBULATORY_CARE_PROVIDER_SITE_OTHER)
Admission: RE | Admit: 2011-09-01 | Discharge: 2011-09-01 | Disposition: A | Discharge status: Home / Self Care | Payer: 59 | Source: Ambulatory Visit | Attending: Physician Assistant | Admitting: Physician Assistant

## 2011-09-01 VITALS — BP 94/64 | HR 76 | Temp 98.5°F | Ht 66.0 in | Wt 169.0 lb

## 2011-09-01 DIAGNOSIS — K5792 Diverticulitis of intestine, part unspecified, without perforation or abscess without bleeding: Secondary | ICD-10-CM

## 2011-09-01 DIAGNOSIS — K5732 Diverticulitis of large intestine without perforation or abscess without bleeding: Secondary | ICD-10-CM

## 2011-09-01 MED ORDER — IOHEXOL 300 MG/ML  SOLN
100.0000 mL | Freq: Once | INTRAMUSCULAR | Status: AC | PRN
  Administered 2011-09-01: 100 mL via INTRAVENOUS

## 2011-09-01 NOTE — Progress Notes (Signed)
  Subjective:    Patient ID: Manuel Jacobson, male    DOB: 05/10/1961, 51 y.o.   MRN: [id number]  HPI Manuel Jacobson is a pleasant 51 year old white male known to Dr. Christella Hartigan. He has a history of recurrent diverticulitis. He underwent colonoscopy with Dr. Christella Hartigan in September of 2012 with finding of one diminutive polyp in the ascending colon and moderate diverticulosis in the sigmoid to descending colon. He says his last episode was.of diverticulitis was early in the summer and he had been doing well until this past Saturday. He started again with lower abdominal discomfort which was very bad on Tuesday, January 1. He saw Dr. Dallas Schimke primary care yesterday and was started on Augmentin 875 twice daily. He says he is still slurred today but better than Tuesday. He has not had any documented fever, chills ,rigors. He does note some pressure with urination but no dysuria. He has been somewhat constipated over the past couple of days. He says this is typical for one of his diverticulitis episodes and pain this time was about as bad as he has had.  Patient reports that he's been dealing with recurrent diverticulitis over the past 18 years but has never required hospitalization. He reports that he usually has 2-3 episodes per year    Review of Systems  Constitutional: Negative.   HENT: Negative.   Eyes: Negative.   Respiratory: Negative.   Cardiovascular: Negative.   Gastrointestinal: Positive for abdominal pain.  Genitourinary: Positive for difficulty urinating.  Musculoskeletal: Negative.   Neurological: Negative.   Hematological: Negative.   Psychiatric/Behavioral: Negative.    Outpatient Prescriptions Prior to Visit  Medication Sig Dispense Refill  . amoxicillin-clavulanate (AUGMENTIN) 875-125 MG per tablet Take 1 tablet by mouth 2 (two) times daily.  20 tablet  0  . hydrochlorothiazide (HYDRODIURIL) 25 MG tablet TAKE 1 TABLET BY MOUTH DAILY  30 tablet  1  . rosuvastatin (CRESTOR) 10 MG tablet Take 1  tablet (10 mg total) by mouth at bedtime.  30 tablet  11   Allergies  Allergen Reactions  . Levofloxacin        Objective:   Physical Exam   Well-developed white male in no acute distress, blood pressure 94/64 pulse 76 temperature 98.5. HEENT; nontraumatic normocephalic EOMI PERRLA sclera anicteric,Neck; Supple no JVD, Cardiovascular; regular rate and rhythm with S1-S2 no murmur gallop, Pulmonary clear bilaterally, Abdomen ; soft he is tender in the suprapubic area and right lower quadrant with guarding no rebound no palpable mass or hepatosplenomegaly, bowel sounds are present Rectal not done, Extremities; no clubbing cyanosis or edema skin warm and dry, Psych; mood and affect normal and appropriate.       Assessment & Plan:  #43 51 year old white male with probable recurrent diverticulitis with acute suprapubic and right lower quadrant discomfort x3 days. Plan; Will obtain CT scan of the abdomen and pelvis to document the diverticulitis and rule out any complicating features. Patient to complete a 10 day course of Augmentin 875 twice daily. I advised him to call should his symptoms worsen at any time and also to call if he is having persistent pain when he finishes the antibiotics. Followup office visit with Dr. Christella Hartigan in 2-3 weeks, at which point would anticipate surgical referral to discuss an elective resection.

## 2011-09-01 NOTE — Patient Instructions (Signed)
We have scheduled the CT scan at [address].   Drink the second bottle of contrast at 3:35 PM.  Try  To arrive at 4:00 PM.   Complete the current course of Augmentin you are taking. Call us if your symptoms worsen.  [phone].

## 2011-09-01 NOTE — Progress Notes (Signed)
Addended byDerry Skill on: 09/01/2011 02:45 PM   Modules accepted: Orders

## 2011-09-01 NOTE — Progress Notes (Signed)
Agree with assessment and plans 

## 2011-09-02 ENCOUNTER — Telehealth: Payer: Self-pay | Admitting: *Deleted

## 2011-09-02 NOTE — Telephone Encounter (Signed)
Called patient and advised him of the CT scan results which shows sigmoid diverticulitis. He should continue with plans as outlined yesterday at office visit.

## 2011-09-02 NOTE — Telephone Encounter (Signed)
Message copied by Derry Skill on Fri Sep 02, 2011  4:12 PM ------      Message from: Roy, Virginia S      Created: Fri Sep 02, 2011  2:02 PM       Please call pt and let him knoe the CT shows sigmoid diverticulitis,uncomplicated. He should continue with plans as outlined yesterday at office visit.

## 2011-09-02 NOTE — Telephone Encounter (Signed)
I called Amy Esterwood PA to discuss what she saw on the CT ABD & Pelvis and it was what she suspected, Sigmoid Diverticulitis. I called the patient on his cell phone to discuss the results of the CT.   Per Amy the patient is to continue his course of Augmentin.  I advised him to call us if symptoms persist and worsen.  He thanked me for calling.

## 2011-09-13 ENCOUNTER — Other Ambulatory Visit: Payer: Self-pay | Admitting: Gastroenterology

## 2011-09-14 ENCOUNTER — Other Ambulatory Visit: Payer: Self-pay | Admitting: *Deleted

## 2011-09-14 MED ORDER — AMOXICILLIN 875 MG PO TABS: ORAL_TABLET | ORAL | Status: DC

## 2011-09-14 NOTE — Telephone Encounter (Signed)
I called the patient on the home phone and got a fast busy.  I called the patient on his cell phone and left a message for him to call me... I asked on my message how he was doing and was he any better.  I advised him we got a request from the pharmacy for a refill on there Amoxicillin.

## 2011-09-30 HISTORY — PX: PARTIAL COLECTOMY: SHX5273

## 2011-10-03 ENCOUNTER — Other Ambulatory Visit: Payer: Self-pay | Admitting: Family Medicine

## 2011-10-12 ENCOUNTER — Telehealth: Payer: Self-pay

## 2011-10-12 NOTE — Telephone Encounter (Signed)
CT Of Pelvis faxed to Dr.Thacker @ [phone] ROI On File 10/12/11/KM

## 2011-10-17 ENCOUNTER — Telehealth: Payer: Self-pay | Admitting: Gastroenterology

## 2011-10-17 NOTE — Telephone Encounter (Signed)
Faxed the pathology report from 05/10/2011 to Dr. Recardo Evangelist office on 10/14/2011 ASW

## 2011-11-28 ENCOUNTER — Other Ambulatory Visit: Payer: Self-pay | Admitting: Family Medicine

## 2012-01-02 ENCOUNTER — Ambulatory Visit (INDEPENDENT_AMBULATORY_CARE_PROVIDER_SITE_OTHER): Payer: 59 | Admitting: Family Medicine

## 2012-01-02 ENCOUNTER — Encounter: Payer: Self-pay | Admitting: Family Medicine

## 2012-01-02 ENCOUNTER — Other Ambulatory Visit: Payer: Self-pay | Admitting: Family Medicine

## 2012-01-02 VITALS — BP 108/78 | HR 68 | Temp 98.5°F | Ht 66.0 in | Wt 161.8 lb

## 2012-01-02 DIAGNOSIS — R109 Unspecified abdominal pain: Secondary | ICD-10-CM

## 2012-01-02 DIAGNOSIS — R300 Dysuria: Secondary | ICD-10-CM

## 2012-01-02 LAB — BASIC METABOLIC PANEL
BUN: 17 mg/dL (ref 6–23)
CO2: 29 mEq/L (ref 19–32)
Chloride: 103 mEq/L (ref 96–112)
Creatinine, Ser: 1.1 mg/dL (ref 0.4–1.5)
Potassium: 3.9 mEq/L (ref 3.5–5.1)

## 2012-01-02 NOTE — Patient Instructions (Signed)
REFERRAL: GO THE THE FRONT ROOM AT THE ENTRANCE OF OUR CLINIC, NEAR CHECK IN. ASK FOR MARION. SHE WILL HELP YOU SET UP YOUR REFERRAL. DATE: TIME:  

## 2012-01-02 NOTE — Progress Notes (Signed)
Patient Name: Manuel Jacobson Date of Birth: [birth year] Age: 51 y.o. Medical Record Number: [id number] Gender: male Date of Encounter: 01/02/2012  History of Present Illness:  Manuel Jacobson is a 51 y.o. very pleasant male patient who presents with the following:  Saw Dr. Christella Hartigan -- given some Augmentin, meant with Dr. Abigail Miyamoto at [facility] and had sigmoid diverticular removed. When started to do the surgery, bladder was adjacent to the abdominal wall. Urology sutured up, then attached to rectum.   Still feels discomfort and when bladder gets full and has discomfort when his bladder is full, and pain with urination.  10/20/2011 - date of surgery.   10 weeks post-op from surgery.  No blood with urination. No obvious fecal material with urination. No blood in the stool.  Patient Active Problem List  Diagnoses  . POSTHERPETIC NEURALGIA  . HYPERTRIGLYCERIDEMIA  . HYPERLIPIDEMIA  . GERD  . HALLUX RIGIDUS  . DIVERTICULITIS, ACUTE  . SCIATICA, LEFT  . Migraine headache without aura   Past Medical History  Diagnosis Date  . Diverticulitis   . GERD (gastroesophageal reflux disease)   . Hypertension   . Hyperlipidemia   . Migraine   . Allergic rhinitis due to pollen     pt denies   Past Surgical History  Procedure Date  . Other surgical history     cyst removed from left heel  . Wisdom tooth extraction   . Partial colectomy 09/2011    Dr. Abigail Miyamoto, [facility], Sigmoid Colectomy   History  Substance Use Topics  . Smoking status: Never Smoker   . Smokeless tobacco: Never Used  . Alcohol Use: 2.4 oz/week    4 Cans of beer per week     weekends   Family History  Problem Relation Age of Onset  . Arthritis Father     mother  . Hypertension Father     mother  . Hyperlipidemia Father     mother  . Breast cancer Maternal Grandmother   . Lung cancer Mother     MGF  . Stroke Mother   . Colon cancer Neg Hx   . Esophageal cancer Neg Hx   . Stomach cancer Neg Hx    Allergies  Allergen  Reactions  . Levofloxacin    Current Outpatient Prescriptions on File Prior to Visit  Medication Sig Dispense Refill  . rosuvastatin (CRESTOR) 10 MG tablet Take 1 tablet (10 mg total) by mouth at bedtime.  30 tablet  11  . DISCONTD: hydrochlorothiazide (HYDRODIURIL) 25 MG tablet TAKE 1 TABLET (25 MG TOTAL) BY MOUTH DAILY.  30 tablet  1  . DISCONTD: hydrochlorothiazide (HYDRODIURIL) 25 MG tablet TAKE 1 TABLET BY MOUTH DAILY  30 tablet  1     Past Medical History, Surgical History, Social History, Family History, Problem List, Medications, and Allergies have been reviewed and updated if relevant.  Review of Systems:  GEN: No acute illnesses, no fevers, chills. GI: No n/v/d, eating normally Pulm: No SOB Interactive and getting along well at home.  Otherwise, ROS is as per the HPI.   Physical Examination: Filed Vitals:   01/02/12 0820  BP: 108/78  Pulse: 68  Temp: 98.5 F (36.9 C)  TempSrc: Oral  Height: 5\' 6"  (1.676 m)  Weight: 161 lb 12.8 oz (73.392 kg)  SpO2: 98%    Body mass index is 26.12 kg/(m^2).   GEN: WDWN, NAD, Non-toxic, A & O x 3 HEENT: Atraumatic, Normocephalic. Neck supple. No masses, No LAD. Ears and Nose: No external  deformity. CV: RRR, No M/G/R. No JVD. No thrill. No extra heart sounds. PULM: CTA B, no wheezes, crackles, rhonchi. No retractions. No resp. distress. No accessory muscle use. EXTR: No c/c/e NEURO Normal gait.  PSYCH: Normally interactive. Conversant. Not depressed or anxious appearing.  Calm demeanor.    Assessment and Plan:  1. Abdominal  pain, other specified site  Basic metabolic panel, CT Abdomen Pelvis W Contrast  2. Dysuria  Basic metabolic panel, CT Abdomen Pelvis W Contrast   Obtain a CT of the abdomen and pelvis with contrast to evaluate postoperative anatomy, evaluate for potential fistula. Intraoperatively, the bladder was entered during the patient's sigmoid colectomy. Evaluate for potential adhesions and for potential altered  bladder anatomy. After scanning, urological consult reasonable.  Orders Today: Orders Placed This Encounter  Procedures  . CT Abdomen Pelvis W Contrast    Standing Status: Future     Number of Occurrences:      Standing Expiration Date: 04/03/2013    RM/MARION [phone]/PT NOT DIAB/NO LABS NEEDED/[employer] INS PC PENDING/PT TO DRINK 2 BTLS CM    Order Specific Question:  Preferred imaging location?    Answer:  [id number]    Order Specific Question:  Reason for exam:    Answer:  post-op 10 weeks, significant pain abd, with urination. s/p sigmoidectomy, small bladder operation  . Basic metabolic panel    Medications Today: No orders of the defined types were placed in this encounter.

## 2012-01-03 ENCOUNTER — Encounter: Payer: Self-pay | Admitting: *Deleted

## 2012-01-04 ENCOUNTER — Ambulatory Visit (INDEPENDENT_AMBULATORY_CARE_PROVIDER_SITE_OTHER)
Admission: RE | Admit: 2012-01-04 | Discharge: 2012-01-04 | Disposition: A | Discharge status: Home / Self Care | Payer: 59 | Source: Ambulatory Visit | Attending: Family Medicine | Admitting: Family Medicine

## 2012-01-04 DIAGNOSIS — R109 Unspecified abdominal pain: Secondary | ICD-10-CM

## 2012-01-04 DIAGNOSIS — R300 Dysuria: Secondary | ICD-10-CM

## 2012-01-04 MED ORDER — IOHEXOL 300 MG/ML  SOLN
100.0000 mL | Freq: Once | INTRAMUSCULAR | Status: AC | PRN
  Administered 2012-01-04: 100 mL via INTRAVENOUS

## 2012-01-05 ENCOUNTER — Telehealth: Payer: Self-pay | Admitting: Family Medicine

## 2012-01-05 ENCOUNTER — Other Ambulatory Visit: Payer: Self-pay | Admitting: Family Medicine

## 2012-01-05 DIAGNOSIS — M549 Dorsalgia, unspecified: Secondary | ICD-10-CM

## 2012-01-05 DIAGNOSIS — Z9049 Acquired absence of other specified parts of digestive tract: Secondary | ICD-10-CM

## 2012-01-05 DIAGNOSIS — R3989 Other symptoms and signs involving the genitourinary system: Secondary | ICD-10-CM

## 2012-01-05 NOTE — Telephone Encounter (Signed)
Patient would like to talk to you regarding the Urology referral. Please call him on his cell at [phone]. Told him you would not be back in until Monday and that is fine with him. If you could try to call him after 1pm on Monday. He still feels like the pain is not coming from his bladder. He says the pain feels the same as when he had the bad diverticullitis just not as acute. He will wait to hear from Dr Patsy Lager.

## 2012-01-06 NOTE — Telephone Encounter (Signed)
D/w him on the phone, he is going to f/u with Dr. Abigail Miyamoto at [facility] and also with Urologist who was involved with his surgical case at [facility]. Pain has been similar since the initial post-operative period.

## 2012-02-27 ENCOUNTER — Other Ambulatory Visit: Payer: Self-pay | Admitting: Family Medicine

## 2012-04-30 ENCOUNTER — Other Ambulatory Visit: Payer: Self-pay | Admitting: Family Medicine

## 2012-05-18 ENCOUNTER — Other Ambulatory Visit: Payer: Self-pay

## 2012-05-18 MED ORDER — SUMATRIPTAN SUCCINATE 50 MG PO TABS: 50.0000 mg | ORAL_TABLET | ORAL | Status: DC | PRN

## 2012-05-18 NOTE — Telephone Encounter (Signed)
Pt request refill Sumatriptan to [facility].Please advise.

## 2012-05-18 NOTE — Telephone Encounter (Signed)
  Hannah Beat, MD 05/18/2012, 8:52 AM

## 2012-05-23 ENCOUNTER — Telehealth: Payer: Self-pay

## 2012-05-23 NOTE — Telephone Encounter (Signed)
Please send in Inderal LA 80 mg, 1 po daily, #30, 5 refills

## 2012-05-23 NOTE — Telephone Encounter (Signed)
Pt left v/m requesting med for migraine. Left v/m for pt to call back; Imitrex recently called in.

## 2012-05-23 NOTE — Telephone Encounter (Signed)
Pt left v/m requesting Propranolol 80 mg beta blocker for migraine h/a to [facility]. Pt had stopped taking but h/a starting again. Can med be called in or does pt need appt.Please advise.

## 2012-05-24 ENCOUNTER — Other Ambulatory Visit: Payer: Self-pay

## 2012-05-24 MED ORDER — PROPRANOLOL HCL ER 80 MG PO CP24: 80.0000 mg | ORAL_CAPSULE | Freq: Every day | ORAL | Status: DC

## 2012-05-24 MED ORDER — PROPRANOLOL HCL 80 MG PO TABS: 80.0000 mg | ORAL_TABLET | Freq: Every day | ORAL | Status: DC

## 2012-05-24 NOTE — Telephone Encounter (Signed)
Inderal LA 80 mg # 30 5 R called in [facility])

## 2012-05-31 ENCOUNTER — Encounter: Payer: Self-pay | Admitting: Family Medicine

## 2012-05-31 ENCOUNTER — Ambulatory Visit (INDEPENDENT_AMBULATORY_CARE_PROVIDER_SITE_OTHER): Payer: 59 | Admitting: Family Medicine

## 2012-05-31 VITALS — BP 125/76 | HR 80 | Temp 98.8°F | Ht 66.0 in | Wt 161.2 lb

## 2012-05-31 DIAGNOSIS — G43009 Migraine without aura, not intractable, without status migrainosus: Secondary | ICD-10-CM

## 2012-05-31 DIAGNOSIS — R110 Nausea: Secondary | ICD-10-CM

## 2012-05-31 MED ORDER — NORTRIPTYLINE HCL 25 MG PO CAPS: 25.0000 mg | ORAL_CAPSULE | Freq: Every day | ORAL | Status: DC

## 2012-05-31 NOTE — Progress Notes (Signed)
Nature conservation officer at [facility] [address] [place] [address] Phone: [phone] Fax: [phone]  Date:  05/31/2012   Name:  Manuel Jacobson   DOB:  1961/07/14   MRN:  [id number] Gender: male Age: 51 y.o.  PCP:  Hannah Beat, MD    Chief Complaint: Headache   History of Present Illness:  Manuel Jacobson is a 51 y.o. pleasant patient who presents with the following:  Changed roles within work, and was having some flare.  Now, having and waking up with bad headaches that do not go away. Some nausea. Before meds, almost every day  Imitrex will help.   Has used 4 imitrex.   Have woken up from sleep, mouth guard Will hurt in the morning No photo or phono phobia.   Headaches have gotten a little better with Inderal, but feels a little depressed  The patient called in approximately 2 weeks ago, and send in some Inderal LA, 80 mg. Since that time, he has had some difficulty taking this, he felt some overall malaise, little bit of depression, and some fatigue. Therefore, he discontinued this and started some amitriptyline 50 mg a day had left over, and felt very fatigued and tired the following morning after taking it. He is here today for recommendations. He is taken 4 Imitrex in the last 2 weeks.  Patient Active Problem List  Diagnosis  . POSTHERPETIC NEURALGIA  . HYPERTRIGLYCERIDEMIA  . HYPERLIPIDEMIA  . GERD  . HALLUX RIGIDUS  . DIVERTICULITIS, ACUTE  . SCIATICA, LEFT  . Migraine headache without aura    Past Medical History  Diagnosis Date  . Diverticulitis   . GERD (gastroesophageal reflux disease)   . Hypertension   . Hyperlipidemia   . Migraine   . Allergic rhinitis due to pollen     pt denies    Past Surgical History  Procedure Date  . Other surgical history     cyst removed from left heel  . Wisdom tooth extraction   . Partial colectomy 09/2011    Dr. Abigail Miyamoto, [facility], Sigmoid Colectomy    History  Substance Use Topics  . Smoking status:  Never Smoker   . Smokeless tobacco: Never Used  . Alcohol Use: 2.4 oz/week    4 Cans of beer per week     weekends    Family History  Problem Relation Age of Onset  . Arthritis Father     mother  . Hypertension Father     mother  . Hyperlipidemia Father     mother  . Breast cancer Maternal Grandmother   . Lung cancer Mother     MGF  . Stroke Mother   . Colon cancer Neg Hx   . Esophageal cancer Neg Hx   . Stomach cancer Neg Hx     Allergies  Allergen Reactions  . Levofloxacin     Medication list has been reviewed and updated.  Outpatient Prescriptions Prior to Visit  Medication Sig Dispense Refill  . CRESTOR 10 MG tablet TAKE 1 TABLET (10 MG TOTAL) BY MOUTH AT BEDTIME.  30 tablet  4  . hydrochlorothiazide (HYDRODIURIL) 25 MG tablet TAKE 1 TABLET (25 MG TOTAL) BY MOUTH DAILY.  30 tablet  4  . SUMAtriptan (IMITREX) 50 MG tablet Take 1 tablet (50 mg total) by mouth every 2 (two) hours as needed for migraine.  10 tablet  5  . propranolol (INDERAL) 80 MG tablet Take 1 tablet (80 mg total) by mouth daily.  30  tablet  3  . propranolol ER (INDERAL LA) 80 MG 24 hr capsule Take 1 capsule (80 mg total) by mouth daily.  30 capsule  11    Review of Systems:  No fevers chills or sweats. No visual changes. Headaches as above. Nausea. No diarrhea. No vomiting. No visual changes. No slurred speech or balance difficulty.  Physical Examination: Filed Vitals:   05/31/12 1429  BP: 125/76  Pulse: 80  Temp: 98.8 F (37.1 C)   Filed Vitals:   05/31/12 1429  Height: 5\' 6"  (1.676 m)  Weight: 161 lb 4 oz (73.143 kg)   Body mass index is 26.03 kg/(m^2). Ideal Body Weight: Weight in (lb) to have BMI = 25: 154.6    GEN: WDWN, NAD, Non-toxic, A & O x 3 HEENT: Atraumatic, Normocephalic. Neck supple. No masses, No LAD. Ears and Nose: No external deformity. CV: RRR, No M/G/R. No JVD. No thrill. No extra heart sounds. PULM: CTA B, no wheezes, crackles, rhonchi. No retractions. No resp.  distress. No accessory muscle use. ABD: S, NT, ND, +BS. No rebound tenderness. No HSM.  EXTR: No c/c/e  Neuro: CN 2-12 grossly intact. PERRLA. EOMI. Sensation intact throughout. Str 5/5 all extremities. DTR 2+. No clonus. A and o x 4. Romberg neg. Finger nose neg. Heel -shin neg.   PSYCH: Normally interactive. Conversant. Not depressed or anxious appearing.  Calm demeanor.     Assessment and Plan:  1. Migraine headache without aura   2. Nausea    Change to Pamelor 25 mg at nighttime. Recheck in 4 weeks.  Imitrex when necessary acute headache.  Orders Today:  No orders of the defined types were placed in this encounter.    Updated Medication List: (Includes new medications, updates to list, dose adjustments) Meds ordered this encounter  Medications  . nortriptyline (PAMELOR) 25 MG capsule    Sig: Take 1 capsule (25 mg total) by mouth at bedtime.    Dispense:  30 capsule    Refill:  2    Medications Discontinued: Medications Discontinued During This Encounter  Medication Reason  . propranolol ER (INDERAL LA) 80 MG 24 hr capsule   . propranolol (INDERAL) 80 MG tablet      Hannah Beat, MD

## 2012-07-11 ENCOUNTER — Encounter: Payer: Self-pay | Admitting: Family Medicine

## 2012-07-11 ENCOUNTER — Ambulatory Visit (INDEPENDENT_AMBULATORY_CARE_PROVIDER_SITE_OTHER): Payer: 59 | Admitting: Family Medicine

## 2012-07-11 VITALS — BP 130/82 | HR 98 | Temp 98.0°F | Ht 66.0 in | Wt 165.2 lb

## 2012-07-11 DIAGNOSIS — G43909 Migraine, unspecified, not intractable, without status migrainosus: Secondary | ICD-10-CM

## 2012-07-11 DIAGNOSIS — R51 Headache: Secondary | ICD-10-CM

## 2012-07-11 MED ORDER — NORTRIPTYLINE HCL 50 MG PO CAPS: 50.0000 mg | ORAL_CAPSULE | Freq: Every day | ORAL | Status: DC

## 2012-07-11 NOTE — Patient Instructions (Addendum)
REFERRAL: GO THE THE FRONT ROOM AT THE ENTRANCE OF OUR CLINIC, NEAR CHECK IN. ASK FOR MARION. SHE WILL HELP YOU SET UP YOUR REFERRAL. DATE: TIME:  

## 2012-07-11 NOTE — Progress Notes (Signed)
Nature conservation officer at [facility] [address] [place] [address] Phone: [phone] Fax: [phone]  Date:  07/11/2012   Name:  Manuel Jacobson   DOB:  [birth year]   MRN:  [id number] Gender: male Age: 51 y.o.  PCP:  Hannah Beat, MD  Evaluating MD: Hannah Beat, MD   Chief Complaint: Follow-up and Migraine   History of Present Illness:  Manuel Jacobson is a 51 y.o. pleasant patient who presents with the following:  F/u migraine, started pamelor:  Feels better, but headache intensity and frequency is still about the same. Felt better almost immediately with change from elavil to pamelor. Still getting headaches "all the time".   No anxiety or depression  Patient Active Problem List  Diagnosis  . POSTHERPETIC NEURALGIA  . HYPERTRIGLYCERIDEMIA  . HYPERLIPIDEMIA  . GERD  . HALLUX RIGIDUS  . DIVERTICULITIS, ACUTE  . SCIATICA, LEFT  . Migraine headache without aura    Past Medical History  Diagnosis Date  . Diverticulitis   . GERD (gastroesophageal reflux disease)   . Hypertension   . Hyperlipidemia   . Migraine   . Allergic rhinitis due to pollen     pt denies    Past Surgical History  Procedure Date  . Other surgical history     cyst removed from left heel  . Wisdom tooth extraction   . Partial colectomy 09/2011    Dr. Abigail Miyamoto, [facility], Sigmoid Colectomy    History  Substance Use Topics  . Smoking status: Never Smoker   . Smokeless tobacco: Never Used  . Alcohol Use: 2.4 oz/week    4 Cans of beer per week     Comment: weekends    Family History  Problem Relation Age of Onset  . Arthritis Father     mother  . Hypertension Father     mother  . Hyperlipidemia Father     mother  . Breast cancer Maternal Grandmother   . Lung cancer Mother     MGF  . Stroke Mother   . Colon cancer Neg Hx   . Esophageal cancer Neg Hx   . Stomach cancer Neg Hx     Allergies  Allergen Reactions  . Levofloxacin     Medication list has been reviewed  and updated.  Outpatient Prescriptions Prior to Visit  Medication Sig Dispense Refill  . CRESTOR 10 MG tablet TAKE 1 TABLET (10 MG TOTAL) BY MOUTH AT BEDTIME.  30 tablet  4  . hydrochlorothiazide (HYDRODIURIL) 25 MG tablet TAKE 1 TABLET (25 MG TOTAL) BY MOUTH DAILY.  30 tablet  4  . nortriptyline (PAMELOR) 25 MG capsule Take 1 capsule (25 mg total) by mouth at bedtime.  30 capsule  2  . SUMAtriptan (IMITREX) 50 MG tablet Take 1 tablet (50 mg total) by mouth every 2 (two) hours as needed for migraine.  10 tablet  5   Last reviewed on 07/11/2012  8:01 AM by Consuello Masse, CMA  Review of Systems:  No slurred speech, weakness, facial drooping  Physical Examination: Filed Vitals:   07/11/12 0800  BP: 130/82  Pulse: 98  Temp: 98 F (36.7 C)  TempSrc: Oral  Height: 5\' 6"  (1.676 m)  Weight: 165 lb 4 oz (74.957 kg)  SpO2: 98%    Body mass index is 26.67 kg/(m^2). Ideal Body Weight: Weight in (lb) to have BMI = 25: 154.6   Neurologic Exam   GEN: WDWN, NAD, Non-toxic, A & O x 3 HEENT: Atraumatic, Normocephalic.  Neck supple. No masses, No LAD. Ears and Nose: No external deformity. CV: RRR, No M/G/R. No JVD. No thrill. No extra heart sounds. PULM: CTA B, no wheezes, crackles, rhonchi. No retractions. No resp. distress. No accessory muscle use. ABD: S, NT, ND, +BS. No rebound tenderness. No HSM.  EXTR: No c/c/e  Neuro: CN 2-12 grossly intact. PERRLA. EOMI. Sensation intact throughout. Str 5/5 all extremities. DTR 2+. No clonus. A and o x 4. Romberg neg. Finger nose neg. Heel -shin neg.   PSYCH: Normally interactive. Conversant. Not depressed or anxious appearing.  Calm demeanor.    Assessment and Plan: 1. Headache  MR Brain Wo Contrast  2. Migraine  Ambulatory referral to Neurology    Consult Neurology, increase pamelor to 50 mg in the interval time  Hannah Beat, MD

## 2012-07-20 ENCOUNTER — Other Ambulatory Visit: Payer: Self-pay

## 2012-07-20 NOTE — Telephone Encounter (Signed)
Pt request refill for Imitrex; pt said he has refills available at [facility] but they will not fill because too early. [facility] pharmacist said not too early according to instructions but pts insurance has quantity limit for 30 day period. Pt said only thing that helps his headache and he will pay out of pocket. [facility] cost $59.52 pt voiced understanding.

## 2012-07-23 ENCOUNTER — Encounter: Payer: Self-pay | Admitting: Family Medicine

## 2012-07-23 ENCOUNTER — Ambulatory Visit (INDEPENDENT_AMBULATORY_CARE_PROVIDER_SITE_OTHER): Payer: 59 | Admitting: Family Medicine

## 2012-07-23 ENCOUNTER — Encounter: Payer: Self-pay | Admitting: *Deleted

## 2012-07-23 ENCOUNTER — Ambulatory Visit (INDEPENDENT_AMBULATORY_CARE_PROVIDER_SITE_OTHER)
Admission: RE | Admit: 2012-07-23 | Discharge: 2012-07-23 | Disposition: A | Discharge status: Home / Self Care | Payer: 59 | Source: Ambulatory Visit | Attending: Family Medicine | Admitting: Family Medicine

## 2012-07-23 VITALS — BP 130/90 | HR 95 | Temp 98.1°F | Ht 66.0 in | Wt 165.8 lb

## 2012-07-23 DIAGNOSIS — R208 Other disturbances of skin sensation: Secondary | ICD-10-CM

## 2012-07-23 DIAGNOSIS — R209 Unspecified disturbances of skin sensation: Secondary | ICD-10-CM

## 2012-07-23 DIAGNOSIS — IMO0002 Reserved for concepts with insufficient information to code with codable children: Secondary | ICD-10-CM

## 2012-07-23 DIAGNOSIS — M5416 Radiculopathy, lumbar region: Secondary | ICD-10-CM

## 2012-07-23 MED ORDER — OXYCODONE-ACETAMINOPHEN 10-325 MG PO TABS: 1.0000 | ORAL_TABLET | ORAL | Status: DC | PRN

## 2012-07-23 MED ORDER — PREDNISONE 20 MG PO TABS: ORAL_TABLET | ORAL | Status: DC

## 2012-07-23 MED ORDER — TIZANIDINE HCL 4 MG PO TABS: 4.0000 mg | ORAL_TABLET | Freq: Every day | ORAL | Status: DC

## 2012-07-23 NOTE — Progress Notes (Signed)
Nature conservation officer at [facility] [address] [place] [address] Phone: [phone] Fax: [phone]  Date:  07/23/2012   Name:  Manuel Jacobson   DOB:  01-06-1961   MRN:  [id number] Gender: male Age: 51 y.o.  PCP:  Hannah Beat, MD  Evaluating MD: Hannah Beat, MD   Chief Complaint: Leg Pain   History of Present Illness:  Manuel Jacobson is a 51 y.o. pleasant patient who presents with the following:  Now with B pain started in the low back, now going down both sides. Started on Saturday.  Back was a little sore, and was laying on a massage chair / machine. Flew in on Thursday night for work.  Also just saw the neurologist. Has been working on that with Dr. Malvin Johns, but going through a lot of imitrex.   Friday night.  Had sciatica before.   Pain severe,went to fast med, has taken oxycodone 10 mg and dilauded 1 mg without much relief. Some decreased sensation in the medial and lateral feet. Decreased ROM at the waist. No bowel or bladder incont. No trauma.  L webspace Lateral and medial B feet decreased sensation   Patient Active Problem List  Diagnosis  . POSTHERPETIC NEURALGIA  . HYPERTRIGLYCERIDEMIA  . HYPERLIPIDEMIA  . GERD  . HALLUX RIGIDUS  . DIVERTICULITIS, ACUTE  . SCIATICA, LEFT  . Migraine headache without aura    Past Medical History  Diagnosis Date  . Diverticulitis   . GERD (gastroesophageal reflux disease)   . Hypertension   . Hyperlipidemia   . Migraine   . Allergic rhinitis due to pollen     pt denies    Past Surgical History  Procedure Date  . Other surgical history     cyst removed from left heel  . Wisdom tooth extraction   . Partial colectomy 09/2011    Dr. Abigail Miyamoto, [facility], Sigmoid Colectomy    History  Substance Use Topics  . Smoking status: Never Smoker   . Smokeless tobacco: Never Used  . Alcohol Use: 2.4 oz/week    4 Cans of beer per week     Comment: weekends    Family History  Problem Relation Age of Onset   . Arthritis Father     mother  . Hypertension Father     mother  . Hyperlipidemia Father     mother  . Breast cancer Maternal Grandmother   . Lung cancer Mother     MGF  . Stroke Mother   . Colon cancer Neg Hx   . Esophageal cancer Neg Hx   . Stomach cancer Neg Hx     Allergies  Allergen Reactions  . Levofloxacin     Medication list has been reviewed and updated.  Outpatient Prescriptions Prior to Visit  Medication Sig Dispense Refill  . CRESTOR 10 MG tablet TAKE 1 TABLET (10 MG TOTAL) BY MOUTH AT BEDTIME.  30 tablet  4  . hydrochlorothiazide (HYDRODIURIL) 25 MG tablet TAKE 1 TABLET (25 MG TOTAL) BY MOUTH DAILY.  30 tablet  4  . nortriptyline (PAMELOR) 50 MG capsule Take 1 capsule (50 mg total) by mouth at bedtime.  30 capsule  3  . SUMAtriptan (IMITREX) 50 MG tablet Take 1 tablet (50 mg total) by mouth every 2 (two) hours as needed for migraine.  10 tablet  5   Last reviewed on 07/23/2012  9:07 AM by Consuello Masse, CMA  Review of Systems:   GEN: No fevers, chills. Nontoxic. Primarily  MSK c/o today. MSK: Detailed in the HPI GI: tolerating PO intake without difficulty Neuro: detailed above, still with severe migraines. Had an episode of vision change last week that resolved with imitrex. O/w neg. Otherwise the pertinent positives of the ROS are noted above.    Physical Examination: Filed Vitals:   07/23/12 0904  BP: 130/90  Pulse: 95  Temp: 98.1 F (36.7 C)  TempSrc: Oral  Height: 5\' 6"  (1.676 m)  Weight: 165 lb 12 oz (75.184 kg)  SpO2: 98%    Body mass index is 26.75 kg/(m^2). Ideal Body Weight: Weight in (lb) to have BMI = 25: 154.6    GEN: Well-developed,well-nourished,in no acute distress; alert,appropriate and cooperative throughout examination HEENT: Normocephalic and atraumatic without obvious abnormalities. Ears, externally no deformities PULM: Breathing comfortably in no respiratory distress EXT: No clubbing, cyanosis, or edema PSYCH:  Normally interactive. Cooperative during the interview. Pleasant. Friendly and conversant. Not anxious or depressed appearing. Normal, full affect.  Range of motion at  the waist: Flexion, extension, lateral bending and rotation: minimal flexion, normal extension and lateral movements.  No echymosis or edema Rises to examination table with mild difficulty Gait: minimally antalgic  Inspection/Deformity: N Paraspinus Tenderness: L5-s1  B Ankle Dorsiflexion (L5,4): 5/5 B Great Toe Dorsiflexion (L5,4): 5/5 Heel Walk (L5): WNL Toe Walk (S1): WNL Rise/Squat (L4): WNL, mild pain  SENSORY B Medial Foot (L4): decreased B B Dorsum (L5): WNL B Lateral (S1): decreased B Light Touch: decreased B Pinprick: decreased B as above L webspace decreased sensation  REFLEXES Knee (L4): 2+ Ankle (S1): 2+  B SLR, seated: pos B SLR, supine: pos B Greater Troch: NT B Log Roll: neg B Sciatic Notch: ttp b   Assessment and Plan:  1. Lumbar radiculopathy, acute  DG Lumbar Spine Complete, oxyCODONE-acetaminophen (PERCOCET) 10-325 MG per tablet, predniSONE (DELTASONE) 20 MG tablet, tiZANidine (ZANAFLEX) 4 MG tablet  2. Decreased sensation of foot     Probable nerve impingement even at multiple levels, most c/w disc herniation. B decreased sensation.  Trial of conservative management with good f/u   Orders Today:  Orders Placed This Encounter  Procedures  . DG Lumbar Spine Complete    Standing Status: Future     Number of Occurrences:      Standing Expiration Date: 09/22/2013    Order Specific Question:  Preferred imaging location?    Answer:  [facility]    Order Specific Question:  Reason for exam:    Answer:  back pain, radiculopathy    Updated Medication List: (Includes new medications, updates to list, dose adjustments) Meds ordered this encounter  Medications  . HYDROmorphone (DILAUDID) 2 MG tablet    Sig: Take 2 mg by mouth every 4 (four) hours as needed.  Marland Kitchen oxyCODONE  (OXY IR/ROXICODONE) 5 MG immediate release tablet    Sig: Take 5 mg by mouth every 4 (four) hours as needed.  Marland Kitchen oxyCODONE-acetaminophen (PERCOCET) 10-325 MG per tablet    Sig: Take 1 tablet by mouth every 4 (four) hours as needed for pain.    Dispense:  60 tablet    Refill:  0  . predniSONE (DELTASONE) 20 MG tablet    Sig: 2 po daily for 1 week, then 1 po daily for 1 week    Dispense:  21 tablet    Refill:  0  . tiZANidine (ZANAFLEX) 4 MG tablet    Sig: Take 1 tablet (4 mg total) by mouth at bedtime.    Dispense:  30  tablet    Refill:  2    Medications Discontinued: There are no discontinued medications.   Hannah Beat, MD

## 2012-07-23 NOTE — Patient Instructions (Addendum)
F/u 3 weeks

## 2012-07-25 ENCOUNTER — Ambulatory Visit: Payer: Self-pay | Admitting: Neurology

## 2012-08-03 ENCOUNTER — Telehealth: Payer: Self-pay | Admitting: Family Medicine

## 2012-08-03 NOTE — Telephone Encounter (Signed)
Patient called to say that he hasnt heard anything about getting an MRI set up . He said he talked to [facility] and told her that he wants to proceed with the MRI. Call the patient at [phone] to schedule him. He said he would go to [facility] for this one. I told him you were out of the office and when the order was put in I would be calling him to get this scheduled.

## 2012-08-04 ENCOUNTER — Other Ambulatory Visit: Payer: Self-pay | Admitting: Family Medicine

## 2012-08-04 DIAGNOSIS — IMO0002 Reserved for concepts with insufficient information to code with codable children: Secondary | ICD-10-CM

## 2012-08-04 DIAGNOSIS — M5416 Radiculopathy, lumbar region: Secondary | ICD-10-CM

## 2012-08-04 NOTE — Telephone Encounter (Signed)
D/w him on the phone, I thought I had ordered previously, but it appears I did not. Orders placed.   Obtain an MRI of the lumbar spine without contrast to evaluate L1 vertebral body height for t2 signal uptake to assess if acute compression fracture, and assess for foraminal stenosis, spinal stenosis, in a case of severe radiculopathy.

## 2012-08-06 ENCOUNTER — Other Ambulatory Visit: Payer: Self-pay | Admitting: Family Medicine

## 2012-08-06 ENCOUNTER — Telehealth: Payer: Self-pay | Admitting: *Deleted

## 2012-08-06 DIAGNOSIS — M5416 Radiculopathy, lumbar region: Secondary | ICD-10-CM

## 2012-08-06 DIAGNOSIS — IMO0002 Reserved for concepts with insufficient information to code with codable children: Secondary | ICD-10-CM

## 2012-08-06 NOTE — Telephone Encounter (Signed)
error 

## 2012-08-10 ENCOUNTER — Other Ambulatory Visit: Payer: Self-pay

## 2012-08-10 NOTE — Telephone Encounter (Signed)
Pt left v/m; pt aware too early to get imitrex; for insurance to pay has to wait until 08/12/12. Pt said he needs med now and is willing to pay out of pocket for med but needs Dr Patsy Lager to OK picking up early at [facility].Please advise.

## 2012-08-11 ENCOUNTER — Ambulatory Visit
Admission: RE | Admit: 2012-08-11 | Discharge: 2012-08-11 | Disposition: A | Discharge status: Home / Self Care | Payer: 59 | Source: Ambulatory Visit | Attending: Family Medicine | Admitting: Family Medicine

## 2012-08-11 DIAGNOSIS — M5416 Radiculopathy, lumbar region: Secondary | ICD-10-CM

## 2012-08-11 DIAGNOSIS — IMO0002 Reserved for concepts with insufficient information to code with codable children: Secondary | ICD-10-CM

## 2012-08-12 MED ORDER — SUMATRIPTAN SUCCINATE 100 MG PO TABS: 50.0000 mg | ORAL_TABLET | ORAL | Status: DC | PRN

## 2012-08-12 NOTE — Telephone Encounter (Signed)
Called in myself   Hannah Beat, MD 08/12/2012, 10:11 AM

## 2012-08-15 ENCOUNTER — Ambulatory Visit (INDEPENDENT_AMBULATORY_CARE_PROVIDER_SITE_OTHER): Payer: 59 | Admitting: Family Medicine

## 2012-08-15 ENCOUNTER — Encounter: Payer: Self-pay | Admitting: Family Medicine

## 2012-08-15 VITALS — BP 110/64 | HR 95 | Temp 98.5°F | Ht 66.0 in | Wt 165.5 lb

## 2012-08-15 DIAGNOSIS — M5416 Radiculopathy, lumbar region: Secondary | ICD-10-CM

## 2012-08-15 DIAGNOSIS — G43909 Migraine, unspecified, not intractable, without status migrainosus: Secondary | ICD-10-CM

## 2012-08-15 DIAGNOSIS — IMO0002 Reserved for concepts with insufficient information to code with codable children: Secondary | ICD-10-CM

## 2012-08-16 ENCOUNTER — Encounter: Payer: Self-pay | Admitting: Family Medicine

## 2012-08-16 NOTE — Progress Notes (Signed)
>15 minutes spent in face to face time with patient, >50% spent in counselling or coordination of care:  followup in regards to the patient's lumbar radiculopathy and his migraine headaches.  Migraines: They're improving, he is on an escalating dose of Pamelor, and he is going to titrate up to 100 mg a day on this, and he actually saw Dr. Malvin Johns yesterday. He has been having intermittently take a lot of Imitrex, but has not had any in 3 days.  Lumbar radiculopathy, significant, status post long taper of oral steroids, required at times significant amount of narcotics. We are reviewing his MRI of the lumbar spine today. I had a lot of clinical concern given his worsening status from initial presentation, but his MRI we reviewed together, and  Is very favorable. There is no evidence of T2 signal uptake at the L1 vertebral body, so the patient does not have a vertebral compression fracture as was initially thought that initially on his lumbar spine film.there is some mild disc herniation at L2-3 as well as L3-4. Clinically, he is dramatically improved, and we also reviewed a back maintenance and core stability program.  Follow up p.r.n. Only for his routine health care.  Dg Lumbar Spine Complete  07/23/2012  *RADIOLOGY REPORT*  Clinical Data: Back pain and radiculopathy.  LUMBAR SPINE - COMPLETE 4+ VIEW  Comparison: CT 01/04/2012.  Findings: Exam demonstrates normal vertebral body alignment.  There is mild spondylosis throughout the lumbar spine.  There is mild disc space narrowing at the L2-3 and L3-4 levels.  Facet arthropathy is present over the lower lumbar spine.  There is subtle loss of anterior vertebral body height of T12 without significant change.  There is subtle loss of anterior vertebral body height of L1 not well seen on the prior CT scan.  Remainder of the exam is unremarkable.  IMPRESSION: Mild spondylosis throughout the lumbar spine with degenerative disc disease at the L2-3 and L3-4  levels.  Stable mild loss of anterior vertebral body height of T12.  Mild loss of anterior vertebral body height of L1 not well seen on the prior exam.  MRI may be helpful to exclude acute/evolving compression fracture.   Original Report Authenticated By: Elberta Fortis, M.D.    Mr Lumbar Spine Wo Contrast  08/12/2012  *RADIOLOGY REPORT*  Clinical Data: Low back and bilateral leg pain for 3 weeks.  MRI LUMBAR SPINE WITHOUT CONTRAST  Technique:  Multiplanar and multiecho pulse sequences of the lumbar spine were obtained without intravenous contrast.  Comparison: Plain films 07/23/2012  Findings:  No vertebral body compression fracture or traumatic subluxation.  2 mm retrolisthesis L3-4 appears facet mediated. Normal conus.  Paravertebral soft tissues unremarkable.  Marrow signal homogeneous.  The individual disc spaces are examined as follows:  L1-2:  Normal.  L2-3:  Shallow central protrusion.  Mild facet arthropathy. Slight canal stenosis.  No L3 nerve root encroachment.  L3-4:  2 mm facet mediated retrolisthesis.  Mild facet arthropathy. Shallow central protrusion.  Mild central canal stenosis could affect either L4 nerve root.  L4-5:  Normal interspace.  Mild facet arthropathy.  L5-S1:  Slight annular bulging without disc protrusion.  Mild facet arthropathy.  No neural encroachment.  Far right and left parasagittal images demonstrate no significant foraminal encroachment.  IMPRESSION: Mild disc pathology at L2-3 and L3-4 with shallow protrusions, as described above.  2 mm facet mediated retrolisthesis L3-L4.  Mild central canal stenosis at this level could affect either L4 nerve root.   Original  Report Authenticated By: Davonna Belling, M.D.

## 2012-08-25 ENCOUNTER — Other Ambulatory Visit: Payer: Self-pay | Admitting: Family Medicine

## 2012-08-27 NOTE — Telephone Encounter (Signed)
Confirm with pt, he should be on higher dose from neurology

## 2012-08-27 NOTE — Telephone Encounter (Signed)
Left vm for pt to callback 

## 2012-08-27 NOTE — Telephone Encounter (Signed)
-   As below

## 2012-08-27 NOTE — Telephone Encounter (Signed)
[facility] electronically request refill pamelor 25 mg. [facility] said on 08/14/12 pt got Pamelor 50 mg with instructions take 2 caps (100 mg) at bedtime from Dr Malvin Johns.Please advise.

## 2012-08-30 NOTE — Telephone Encounter (Signed)
Patient does not need refill he gets it from dr. Malvin Johns

## 2012-08-30 NOTE — Telephone Encounter (Signed)
Thanks. That is what I thought.

## 2012-10-04 ENCOUNTER — Encounter: Payer: Self-pay | Admitting: Family Medicine

## 2012-10-04 ENCOUNTER — Ambulatory Visit (INDEPENDENT_AMBULATORY_CARE_PROVIDER_SITE_OTHER): Payer: 59 | Admitting: Family Medicine

## 2012-10-04 VITALS — BP 120/84 | HR 107 | Temp 98.3°F | Ht 66.0 in | Wt 167.5 lb

## 2012-10-04 DIAGNOSIS — R3911 Hesitancy of micturition: Secondary | ICD-10-CM

## 2012-10-04 MED ORDER — TAMSULOSIN HCL 0.4 MG PO CAPS: 0.4000 mg | ORAL_CAPSULE | Freq: Every day | ORAL | Status: DC

## 2012-10-04 MED ORDER — NORTRIPTYLINE HCL 50 MG PO CAPS: 100.0000 mg | ORAL_CAPSULE | Freq: Every day | ORAL | Status: AC

## 2012-10-04 NOTE — Progress Notes (Signed)
[facility] [address] [place] [address] Phone: [phone] Fax: [phone]  Date:  10/04/2012   Name:  Manuel Jacobson   DOB:  06-10-1961   MRN:  [id number] Gender: male Age: 52 y.o.  Primary Physician:  Hannah Beat, MD  Evaluating MD: Hannah Beat, MD   Chief Complaint: bladder problems   History of Present Illness:  Manuel Jacobson is a 52 y.o. pleasant patient who presents with the following:  3-4 weeks. History, he had a prior partial colectomy in 09/2011 at [facility], and at that time, they did hit his bladder requiring intraoperative repair. He has had some intermittent pain since then. Now feels like incomplete emptying of his bladder. Going more often at night.   Patient Active Problem List  Diagnosis  . POSTHERPETIC NEURALGIA  . HYPERTRIGLYCERIDEMIA  . HYPERLIPIDEMIA  . GERD  . HALLUX RIGIDUS  . DIVERTICULITIS, ACUTE  . SCIATICA, LEFT  . Migraine headache without aura    Past Medical History  Diagnosis Date  . Diverticulitis   . GERD (gastroesophageal reflux disease)   . Hypertension   . Hyperlipidemia   . Migraine     Past Surgical History  Procedure Date  . Other surgical history     cyst removed from left heel  . Wisdom tooth extraction   . Partial colectomy 09/2011    Dr. Abigail Miyamoto, [facility], Sigmoid Colectomy    History  Substance Use Topics  . Smoking status: Never Smoker   . Smokeless tobacco: Never Used  . Alcohol Use: 2.4 oz/week    4 Cans of beer per week     Comment: weekends    Family History  Problem Relation Age of Onset  . Arthritis Father     mother  . Hypertension Father     mother  . Hyperlipidemia Father     mother  . Breast cancer Maternal Grandmother   . Lung cancer Mother     MGF  . Stroke Mother   . Colon cancer Neg Hx   . Esophageal cancer Neg Hx   . Stomach cancer Neg Hx     Allergies  Allergen Reactions  . Levofloxacin     Medication list has been reviewed and  updated.  Outpatient Prescriptions Prior to Visit  Medication Sig Dispense Refill  . CRESTOR 10 MG tablet TAKE 1 TABLET (10 MG TOTAL) BY MOUTH AT BEDTIME.  30 tablet  4  . hydrochlorothiazide (HYDRODIURIL) 25 MG tablet TAKE 1 TABLET (25 MG TOTAL) BY MOUTH DAILY.  30 tablet  4  . nortriptyline (PAMELOR) 50 MG capsule Take 1 capsule (50 mg total) by mouth at bedtime.  30 capsule  3  . SUMAtriptan (IMITREX) 100 MG tablet Take 0.5 tablets (50 mg total) by mouth every 2 (two) hours as needed for migraine.  10 tablet  11  . [DISCONTINUED] oxyCODONE-acetaminophen (PERCOCET) 10-325 MG per tablet Take 1 tablet by mouth every 4 (four) hours as needed for pain.  60 tablet  0  . [DISCONTINUED] tiZANidine (ZANAFLEX) 4 MG tablet Take 1 tablet (4 mg total) by mouth at bedtime.  30 tablet  2   Last reviewed on 10/04/2012  9:09 AM by Hannah Beat, MD  Review of Systems:  Also with some dry mouth, increased Pamelor to 100 mg at night in 07/2012. Headaches are much improved.   Physical Examination: BP 120/84  Pulse 107  Temp 98.3 F (36.8 C) (Oral)  Ht 5\' 6"  (1.676 m)  Wt  167 lb 8 oz (75.978 kg)  BMI 27.04 kg/m2  SpO2 98%  Ideal Body Weight: Weight in (lb) to have BMI = 25: 154.6    GEN: WDWN, NAD, Non-toxic, Alert & Oriented x 3 HEENT: Atraumatic, Normocephalic.  Ears and Nose: No external deformity. EXTR: No clubbing/cyanosis/edema NEURO: Normal gait.  PSYCH: Normally interactive. Conversant. Not depressed or anxious appearing.  Calm demeanor.  GU: normal male. Prostate normal without mass, normal size  Assessment and Plan:  1. Urinary hesitancy    This may be a combination - classically sounds more like BPH, but he does not have a large prostate. He could be getting some urinary retention from TCA, but we are both reluctant to alter this since he was doing so poorly from a migraine standpoint.   Trial of flomax  Orders Today:  No orders of the defined types were placed in this  encounter.    Updated Medication List: (Includes new medications, updates to list, dose adjustments) Meds ordered this encounter  Medications  . nortriptyline (PAMELOR) 50 MG capsule    Sig: Take 2 capsules (100 mg total) by mouth at bedtime.    Dispense:  30 capsule    Refill:  3  . Tamsulosin HCl (FLOMAX) 0.4 MG CAPS    Sig: Take 1 capsule (0.4 mg total) by mouth daily.    Dispense:  30 capsule    Refill:  5    Medications Discontinued: Medications Discontinued During This Encounter  Medication Reason  . oxyCODONE-acetaminophen (PERCOCET) 10-325 MG per tablet Error  . tiZANidine (ZANAFLEX) 4 MG tablet Error  . nortriptyline (PAMELOR) 50 MG capsule Reorder     Signed, Karleen Hampshire T. Deloyce Walthers, MD 10/04/2012 9:09 AM

## 2012-10-05 ENCOUNTER — Telehealth: Payer: Self-pay

## 2012-10-05 NOTE — Telephone Encounter (Signed)
Pt saw Dr Patsy Lager on 10/04/12;took 1 Flomax last night; 6 AM this morning pt got out of bed and was extremely dizzy,nauseated and had cold sweat;felt like BP dropped; pt layed down and felt better. Pt still feels slightly dizzy when stands but pt feels like med getting out of his system. Advised pt to stand up slowly when gets up. Pt said one year ago took Flomax and Flomax caused dizziness then also. Pt did not take BP med this morning. Pt wants to know alternative treatment instead of Flomax.Pt will not take anymore Flomax.[facility].Please advise.Pt request call back.

## 2012-10-06 MED ORDER — FINASTERIDE 5 MG PO TABS: 5.0000 mg | ORAL_TABLET | Freq: Every day | ORAL | Status: DC

## 2012-10-06 NOTE — Telephone Encounter (Signed)
Discussed. He is going to try to decrease pamelor to 50 mg to see if that helps. Sending in proscar.   Hannah Beat, MD 10/06/2012, 1:49 PM

## 2012-10-22 ENCOUNTER — Other Ambulatory Visit: Payer: Self-pay | Admitting: Family Medicine

## 2013-03-21 ENCOUNTER — Encounter: Payer: Self-pay | Admitting: Family Medicine

## 2013-03-21 ENCOUNTER — Ambulatory Visit (INDEPENDENT_AMBULATORY_CARE_PROVIDER_SITE_OTHER): Payer: 59 | Admitting: Family Medicine

## 2013-03-21 VITALS — BP 112/80 | HR 88 | Temp 98.5°F | Ht 66.0 in | Wt 173.0 lb

## 2013-03-21 DIAGNOSIS — G8929 Other chronic pain: Secondary | ICD-10-CM

## 2013-03-21 DIAGNOSIS — R52 Pain, unspecified: Secondary | ICD-10-CM

## 2013-03-21 DIAGNOSIS — R141 Gas pain: Secondary | ICD-10-CM

## 2013-03-21 DIAGNOSIS — R143 Flatulence: Secondary | ICD-10-CM

## 2013-03-21 DIAGNOSIS — R1013 Epigastric pain: Secondary | ICD-10-CM

## 2013-03-21 NOTE — Assessment & Plan Note (Signed)
Eval with labs. R/O pancreatitis, eval liver etc.  May be gastritis less likely PUD due to aleve. Start prilosec 40 mg daily. If not improving consider [place] of gallbladder.

## 2013-03-21 NOTE — Assessment & Plan Note (Signed)
Possibly due to gas producing bacterial Trial of align (or lactobaccili).

## 2013-03-21 NOTE — Progress Notes (Signed)
  Subjective:    Patient ID: Manuel Jacobson, male    DOB: 06/16/1961, 52 y.o.   MRN: [id number]  HPI 51 year old male pt of Dr. Patsy Lager presents with pain over right lower rib cage and through to back after eating.  Notes at bedtime as well. Worse with lying flat.  No  worse with fatty foods. Improves if rolls on left side.  Pain lasts 1 -2 hours after eating. No associated symptpoms... No burping, no sour taste in mouth.  No vomiting. No D/C. Occ nausea. Has had a tremendous amount of flatus for past few years.  Does not feel like acid reflux he has had in past. Taking aleve daily for several months.. for back issues.  Last EGD  04/2011: mild gastritis at that time. Mother PUD     Review of Systems  Constitutional: Negative for fever and fatigue.  HENT: Negative for ear pain.   Eyes: Negative for pain.  Respiratory: Negative for shortness of breath.   Cardiovascular: Negative for chest pain.  Gastrointestinal: Positive for abdominal pain. Negative for abdominal distention and anal bleeding.       Objective:   Physical Exam  Constitutional: Vital signs are normal. He appears well-developed and well-nourished.  HENT:  Head: Normocephalic.  Right Ear: Hearing normal.  Left Ear: Hearing normal.  Nose: Nose normal.  Mouth/Throat: Oropharynx is clear and moist and mucous membranes are normal.  Neck: Trachea normal. Carotid bruit is not present. No mass and no thyromegaly present.  Cardiovascular: Normal rate, regular rhythm and normal pulses.  Exam reveals no gallop, no distant heart sounds and no friction rub.   No murmur heard. No peripheral edema  Pulmonary/Chest: Effort normal and breath sounds normal. No respiratory distress.  Abdominal: Normal appearance and bowel sounds are normal. There is no hepatosplenomegaly. There is no tenderness. There is no rigidity, no rebound, no guarding and no CVA tenderness. No hernia.  Points to area where pain occurs as epigastric and RUQ  as well as right mid back.  no current pain  Skin: Skin is warm, dry and intact. No rash noted.  Psychiatric: He has a normal mood and affect. His speech is normal and behavior is normal. Thought content normal.          Assessment & Plan:

## 2013-03-21 NOTE — Patient Instructions (Addendum)
Stop aleve. Start prilosec 2 x 20 mg tabs daily for possible ulcer/gastritis. We will call with lab results. If negative but pain continuing we will eval with [place] of gallbladder.  AVOID fatty greasy rich foods. Can try align to try to decrease flatus.

## 2013-03-22 LAB — COMPREHENSIVE METABOLIC PANEL
AST: 27 U/L (ref 0–37)
Albumin: 4.6 g/dL (ref 3.5–5.2)
Alkaline Phosphatase: 46 U/L (ref 39–117)
Potassium: 4.5 mEq/L (ref 3.5–5.1)
Sodium: 137 mEq/L (ref 135–145)
Total Protein: 7.6 g/dL (ref 6.0–8.3)

## 2013-03-22 LAB — CBC WITH DIFFERENTIAL/PLATELET
Basophils Relative: 0.6 % (ref 0.0–3.0)
Eosinophils Absolute: 0.2 10*3/uL (ref 0.0–0.7)
HCT: 46 % (ref 39.0–52.0)
Lymphs Abs: 2.4 10*3/uL (ref 0.7–4.0)
MCHC: 33.5 g/dL (ref 30.0–36.0)
MCV: 93.2 fl (ref 78.0–100.0)
Monocytes Absolute: 0.7 10*3/uL (ref 0.1–1.0)
Neutrophils Relative %: 43.2 % (ref 43.0–77.0)
Platelets: 215 10*3/uL (ref 150.0–400.0)

## 2013-03-22 LAB — LIPASE: Lipase: 24 U/L (ref 11.0–59.0)

## 2013-03-25 ENCOUNTER — Other Ambulatory Visit: Payer: Self-pay | Admitting: Family Medicine

## 2013-03-25 ENCOUNTER — Ambulatory Visit: Payer: 59 | Admitting: Family Medicine

## 2013-04-11 ENCOUNTER — Encounter: Payer: Self-pay | Admitting: Family Medicine

## 2013-04-11 ENCOUNTER — Ambulatory Visit (INDEPENDENT_AMBULATORY_CARE_PROVIDER_SITE_OTHER): Payer: 59 | Admitting: Family Medicine

## 2013-04-11 VITALS — BP 110/72 | HR 109 | Temp 99.3°F | Ht 66.0 in | Wt 168.0 lb

## 2013-04-11 DIAGNOSIS — R61 Generalized hyperhidrosis: Secondary | ICD-10-CM

## 2013-04-11 DIAGNOSIS — R52 Pain, unspecified: Secondary | ICD-10-CM

## 2013-04-11 DIAGNOSIS — R232 Flushing: Secondary | ICD-10-CM

## 2013-04-11 DIAGNOSIS — R1013 Epigastric pain: Secondary | ICD-10-CM

## 2013-04-11 NOTE — Patient Instructions (Addendum)
Contact neurologist about possible SE to depakote.  If hot flashes not improving overtime, let PCP to know for further lab eval.

## 2013-04-11 NOTE — Progress Notes (Signed)
  Subjective:    Patient ID: Woodroe Vogan, male    DOB: 08-10-1961, 52 y.o.   MRN: [id number]  HPI  52 year old male pt of Dr. Cyndie Chime presents for  3-4 days of feeling hot. Low grade temp 99.9. Occurs episodic, last few minutes to an hour at time. Neurologist for migraine has increase depakote (125 BID now up to 250 BID), was on prednisone for 6 days , completed that course 5-6 days ago. Helped with migraine flare. No hot flashes while taking the med.   Occ nausea. No heart racing, no elevated BP. No anxiety, no jitteriness. Some poor sleep lately. No unexpected weight loss.  No SOB, no wheeze.   Recently seen by myself for epigastric pain. Started on PPI. Labs were nml.  epigastric symptoms are much better.  Family history: Mother and PGF with lung cancer, MGM breast cancer.   Review of Systems  Constitutional: Negative for fever and fatigue.  HENT: Negative for ear pain.   Eyes: Negative for pain.  Respiratory: Negative for shortness of breath.   Cardiovascular: Negative for chest pain.       Objective:   Physical Exam  Constitutional: Vital signs are normal. He appears well-developed and well-nourished.  HENT:  Head: Normocephalic.  Right Ear: Hearing normal.  Left Ear: Hearing normal.  Nose: Nose normal.  Mouth/Throat: Oropharynx is clear and moist and mucous membranes are normal.  Neck: Trachea normal. Carotid bruit is not present. No mass and no thyromegaly present.  Cardiovascular: Normal rate, regular rhythm and normal pulses.  Exam reveals no gallop, no distant heart sounds and no friction rub.   No murmur heard. No peripheral edema  Pulmonary/Chest: Effort normal and breath sounds normal. No respiratory distress.  Skin: Skin is warm, dry and intact. No rash noted.  Psychiatric: He has a normal mood and affect. His speech is normal and behavior is normal. Thought content normal.          Assessment & Plan:

## 2013-04-11 NOTE — Assessment & Plan Note (Signed)
Resolving with PPI.

## 2013-04-11 NOTE — Assessment & Plan Note (Signed)
Most likely secondary to increase in dose of depakote vs coming off prednisone taper.  No red flags.  Observe, call if not resolving or increasing. Could consider thyroid eval.

## 2013-05-09 ENCOUNTER — Telehealth: Payer: Self-pay | Admitting: Family Medicine

## 2013-05-09 DIAGNOSIS — R1013 Epigastric pain: Secondary | ICD-10-CM

## 2013-05-09 NOTE — Telephone Encounter (Signed)
The patient called and is hoping to find out if he can get an order for a CT scan or if he needs to be seen for a repeat ov.  He states his side pain is still present.  Please advise on which route you prefer the patient taking.  Thanks!

## 2013-05-09 NOTE — Telephone Encounter (Signed)
Spoke with Manuel Jacobson and he would like to go ahead with the abdominal ultrasound.

## 2013-05-09 NOTE — Telephone Encounter (Signed)
Let the patient the next step would be an [place] of abdomen. Let me know if he is agreeable.

## 2013-05-09 NOTE — Addendum Note (Signed)
Addended byKerby Nora E on: 05/09/2013 05:02 PM   Modules accepted: Orders

## 2013-05-09 NOTE — Telephone Encounter (Signed)
I have not evaluated him for this or seen him in more than 7 months, so I don't think could appropriately comment at all.   I am going to ask my partner's opinion who recently saw him twice. I am very happy to see him on Monday, but I am about to leave town. Dr. B mentioned an ultrasound in her notes.

## 2013-05-15 ENCOUNTER — Encounter: Payer: Self-pay | Admitting: Neurology

## 2013-05-15 ENCOUNTER — Ambulatory Visit (INDEPENDENT_AMBULATORY_CARE_PROVIDER_SITE_OTHER): Payer: 59 | Admitting: Neurology

## 2013-05-15 VITALS — BP 122/83 | HR 94 | Ht 66.0 in | Wt 172.0 lb

## 2013-05-15 DIAGNOSIS — F513 Sleepwalking [somnambulism]: Secondary | ICD-10-CM

## 2013-05-15 DIAGNOSIS — G478 Other sleep disorders: Secondary | ICD-10-CM

## 2013-05-15 DIAGNOSIS — G2581 Restless legs syndrome: Secondary | ICD-10-CM

## 2013-05-15 DIAGNOSIS — F518 Other sleep disorders not due to a substance or known physiological condition: Secondary | ICD-10-CM

## 2013-05-15 DIAGNOSIS — G4733 Obstructive sleep apnea (adult) (pediatric): Secondary | ICD-10-CM

## 2013-05-15 NOTE — Progress Notes (Signed)
Subjective:    Patient ID: Manuel Jacobson is a 52 y.o. male.  HPI  Huston Foley, MD, PhD [facility] [address], [address] [address] [place], [place] [address]  Dear Dr. Helmut Muster,   I saw your patient, Manuel Jacobson, upon your kind request in my neurologic clinic today for initial consultation of his sleep disorder, in particular, concern for obstructive sleep apnea. The patient is unaccompanied today. As you know, Mr. [name] is a very friendly 52 year old right-handed gentleman with an underlying medical history of migraine HAs, HTN, HLP, who reports snoring and excessive daytime somnolence as well as witnessed apneas. He lost about 25 lb some 2 1/2 years ago and used to snore much heavier, but still snores loudly enough to bother his wife's sleep. He works as a Product manager. His typical bedtime is around 10 PM and wake time is around 6:30 AM. He does take a nap sometimes after work, most of the time inadvertant naps. He also endorses RLS Sx in the evening while watching TV. He talks in his sleep, kicks in his sleep, he walks in his sleep. He does not wake up refreshed. He endorses EDS and his ESS is 11/24 today.  He grinds his teeth at night and uses a night guard.   He wakes up on an average 4 times in the middle of the night and has to go to the bathroom 2 times on a typical night. He reports frequent morning headaches. He has not fallen asleep while driving. The patient has not been taking a planned nap. He has been known to snore for the past many years. Snoring is reportedly moderate, and associated with choking sounds and witnessed apneas. The patient admits to a sense of choking or strangling feeling occasionally. There is no report of nighttime reflux, with no nighttime cough experienced. The patient has noted any RLS symptoms and is known to kick while asleep or before falling asleep. There is no family history of RLS or OSA, but a very strong FHx of migraines. His  mother snored heavily and died at age 41 from lung cancer. She had strokes as well.   He is a restless sleeper and in the morning, the bed is quite disheveled.   He denies cataplexy, sleep paralysis, hypnagogic or hypnopompic hallucinations, or sleep attacks. He does report occasional vivid dreams, but no nightmares, dream enactments. He has parasomnias, including sleep talking and sleep walking. The patient has not had a sleep study or a home sleep test.    His Past Medical History Is Significant For: Past Medical History  Diagnosis Date  . Diverticulitis   . GERD (gastroesophageal reflux disease)   . Hypertension   . Hyperlipidemia   . Migraine     His Past Surgical History Is Significant For: Past Surgical History  Procedure Laterality Date  . Other surgical history      cyst removed from left heel  . Wisdom tooth extraction    . Partial colectomy  09/2011    Dr. Abigail Miyamoto, [facility], Sigmoid Colectomy    His Family History Is Significant For: Family History  Problem Relation Age of Onset  . Arthritis Father     mother  . Hypertension Father     mother  . Hyperlipidemia Father     mother  . Breast cancer Maternal Grandmother   . Lung cancer Mother     MGF  . Stroke Mother   . Colon cancer Neg Hx   . Esophageal  cancer Neg Hx   . Stomach cancer Neg Hx     His Social History Is Significant For: History   Social History  . Marital Status: Single    Spouse Name: Marcelino Duster    Number of Children: 0  . Years of Education: College   Occupational History  . OPERATION ANALYSIS MANAGER    Social History Main Topics  . Smoking status: Never Smoker   . Smokeless tobacco: Never Used  . Alcohol Use: 2.4 oz/week    4 Cans of beer per week     Comment: occasionally  . Drug Use: No  . Sexual Activity: None   Other Topics Concern  . None   Social History Narrative   Patient lives at home with spouse.   Caffeine Use: none    His Allergies Are:  Allergies  Allergen  Reactions  . Flomax [Tamsulosin Hcl] Other (See Comments)    Dizziness,nausea and cold sweat  . Levofloxacin Hives  . Topamax [Topiramate]   :   His Current Medications Are:  Outpatient Encounter Prescriptions as of 05/15/2013  Medication Sig Dispense Refill  . amoxicillin (AMOXIL) 500 MG capsule Take 1 capsule by mouth daily.      . CRESTOR 10 MG tablet TAKE 1 TABLET (10 MG TOTAL) BY MOUTH AT BEDTIME.  30 tablet  4  . divalproex (DEPAKOTE SPRINKLE) 125 MG capsule Take 1 capsule by mouth 2 (two) times daily.      . hydrochlorothiazide (HYDRODIURIL) 25 MG tablet TAKE 1 TABLET (25 MG TOTAL) BY MOUTH DAILY.  30 tablet  4  . nortriptyline (PAMELOR) 50 MG capsule Take 2 capsules (100 mg total) by mouth at bedtime.  30 capsule  3  . Riboflavin (B2) 100 MG TABS Take by mouth.      . SUMAtriptan (IMITREX) 100 MG tablet        No facility-administered encounter medications on file as of 05/15/2013.  :  Review of Systems:  Out of a complete 14 point review of systems, all are reviewed and negative with the exception of these symptoms as listed below:  Review of Systems  Constitutional: Positive for fatigue.  Respiratory:       Snoring  Endocrine: Positive for heat intolerance (feeling hot).  Neurological: Positive for headaches.  Psychiatric/Behavioral: Positive for sleep disturbance (sleepiness, snoring, restless legs).    Objective:  Neurologic Exam  Physical Exam Physical Examination:   Filed Vitals:   05/15/13 0816  BP: 122/83  Pulse: 94    General Examination: The patient is a very pleasant 52 y.o. male in no acute distress. He appears well-developed and well-nourished and very well groomed.   HEENT: Normocephalic, atraumatic, pupils are equal, round and reactive to light and accommodation. Funduscopic exam is normal with sharp disc margins noted. Extraocular tracking is good without limitation to gaze excursion or nystagmus noted. Normal smooth pursuit is noted. Hearing is  grossly intact. Tympanic membranes are clear bilaterally. Face is symmetric with normal facial animation and normal facial sensation. Speech is clear with no dysarthria noted. There is no hypophonia. There is no lip, neck/head, jaw or voice tremor. Neck is supple with full range of passive and active motion. There are no carotid bruits on auscultation. Oropharynx exam reveals: mild mouth dryness, good dental hygiene and moderate airway crowding, due to narrow airway entry and thicker soft palate. Mallampati is class II. Tongue protrudes centrally and palate elevates symmetrically. Tonsils are 1+. Neck size is 16.25 inches.   Chest: Clear to  auscultation without wheezing, rhonchi or crackles noted.  Heart: S1+S2+0, regular and normal without murmurs, rubs or gallops noted.   Abdomen: Soft, non-tender and non-distended with normal bowel sounds appreciated on auscultation.  Extremities: There is no pitting edema in the distal lower extremities bilaterally. Pedal pulses are intact.  Skin: Warm and dry without trophic changes noted. There are no varicose veins.  Musculoskeletal: exam reveals no obvious joint deformities, tenderness or joint swelling or erythema.   Neurologically:  Mental status: The patient is awake, alert and oriented in all 4 spheres. His memory, attention, language and knowledge are appropriate. There is no aphasia, agnosia, apraxia or anomia. Speech is clear with normal prosody and enunciation. Thought process is linear. Mood is congruent and affect is normal.  Cranial nerves are as described above under HEENT exam. In addition, shoulder shrug is normal with equal shoulder height noted. Motor exam: Normal bulk, strength and tone is noted. There is no drift, tremor or rebound. Romberg is negative. Reflexes are 2+ throughout. Toes are downgoing bilaterally. Fine motor skills are intact with normal finger taps, normal hand movements, normal rapid alternating patting, normal foot taps  and normal foot agility.  Cerebellar testing shows no dysmetria or intention tremor on finger to nose testing. Heel to shin is unremarkable bilaterally. There is no truncal or gait ataxia.  Sensory exam is intact to light touch, pinprick, vibration, temperature sense in the upper and lower extremities.  Gait, station and balance are unremarkable. No veering to one side is noted. No leaning to one side is noted. Posture is age-appropriate and stance is narrow based. No problems turning are noted. He turns en bloc. Tandem walk is unremarkable. Intact toe and heel stance is noted.               Assessment and Plan:   In summary, Nuel Dejaynes is a very pleasant 52 y.o.-year old male with a history and physical exam concerning for obstructive sleep apnea (OSA). I had a long chat with the patient about my findings and the diagnosis, its prognosis and treatment options. We talked about medical treatments and non-pharmacological approaches. I explained in particular the risks and ramifications of untreated moderate to severe OSA, especially with respect to developing cardiovascular disease down the Road, including congestive heart failure, difficult to treat hypertension, cardiac arrhythmias, or stroke. Even type 2 diabetes has in part been linked to untreated OSA. While he has a classic history of migraine headaches, I believe this morning headaches may in part be due to untreated obstructive sleep apnea as well. We talked about trying to maintain a healthy lifestyle in general, as well as the importance of weight control. I encouraged the patient to eat healthy, exercise daily and keep well hydrated, to keep a scheduled bedtime and wake time routine, to not skip any meals and eat healthy snacks in between meals.  He has been successful in losing weight and maintaining his weight. He endorses parasomnias, bruxism, restless leg symptoms, and PLMs while asleep. I think an overnight sleep test will help delineate the  degree of some of his sleep related issues. Treating him for obstructive sleep apnea will also be beneficial for his daytime somnolence, complaint of nonrestorative sleep, and may even help his leg kicking. I recommended the following at this time: sleep study with potential positive airway pressure titration.  I explained the sleep test procedure to the patient and also outlined possible surgical and non-surgical treatment options of OSA, including the use of  a custom-made dental device, upper airway surgical options, such as pillar implants, radiofrequency surgery, tongue base surgery, and UPPP. I also explained the CPAP treatment option to the patient, who indicated that he would be willing to try CPAP if the need arises. I explained the importance of being compliant with PAP treatment, not only for insurance purposes but primarily to improve His symptoms, and for the patient's long term health benefit, including to reduce His cardiovascular risks. I answered all his questions today and the patient was in agreement. I would like to see him back after the sleep study is completed and encouraged him to call with any interim questions, concerns, problems or updates.   Thank you very much for allowing me to participate in the care of this nice patient. If I can be of any further assistance to you please do not hesitate to call me at [phone].  Sincerely,   Huston Foley, MD, PhD

## 2013-05-15 NOTE — Patient Instructions (Signed)

## 2013-05-16 ENCOUNTER — Telehealth: Payer: Self-pay

## 2013-05-16 ENCOUNTER — Ambulatory Visit
Admission: RE | Admit: 2013-05-16 | Discharge: 2013-05-16 | Disposition: A | Discharge status: Home / Self Care | Payer: 59 | Source: Ambulatory Visit | Attending: Family Medicine | Admitting: Family Medicine

## 2013-05-16 DIAGNOSIS — R1013 Epigastric pain: Secondary | ICD-10-CM

## 2013-05-16 DIAGNOSIS — R160 Hepatomegaly, not elsewhere classified: Secondary | ICD-10-CM

## 2013-05-16 NOTE — Telephone Encounter (Signed)
Erskine Squibb from [facility] called report for US Abdomen; Dr Ermalene Searing will be in office on 05/17/13, Dr Patsy Lager will be in office today at 10:00 AM. Spoke with Dr Sharen Hones about called report and he advised to send to Dr Patsy Lager. Report is in Epic.

## 2013-05-16 NOTE — Telephone Encounter (Signed)
Non-emergent f/u MRI rec

## 2013-05-17 NOTE — Telephone Encounter (Signed)
Discussed [place] with pt.  Pt with continued symptoms. Ordered MR.  Go to ER if increase in pain, persistant pain, fever, N/V uncontrollably.

## 2013-05-26 ENCOUNTER — Ambulatory Visit
Admission: RE | Admit: 2013-05-26 | Discharge: 2013-05-26 | Disposition: A | Discharge status: Home / Self Care | Payer: 59 | Source: Ambulatory Visit | Attending: Family Medicine | Admitting: Family Medicine

## 2013-05-26 ENCOUNTER — Inpatient Hospital Stay: Admission: RE | Admit: 2013-05-26 | Payer: 59 | Source: Ambulatory Visit

## 2013-05-26 DIAGNOSIS — R160 Hepatomegaly, not elsewhere classified: Secondary | ICD-10-CM

## 2013-05-26 MED ORDER — GADOBENATE DIMEGLUMINE 529 MG/ML IV SOLN
15.0000 mL | Freq: Once | INTRAVENOUS | Status: AC | PRN
  Administered 2013-05-26: 15 mL via INTRAVENOUS

## 2013-05-28 NOTE — Progress Notes (Signed)
Symptoms could be from lactose intolerance, although pain with this is usually lower down in abdomen... If not improving in next week of lactose... Let me know for GI referral.

## 2013-06-12 ENCOUNTER — Ambulatory Visit (INDEPENDENT_AMBULATORY_CARE_PROVIDER_SITE_OTHER): Payer: 59 | Admitting: Neurology

## 2013-06-12 DIAGNOSIS — G479 Sleep disorder, unspecified: Secondary | ICD-10-CM

## 2013-06-12 DIAGNOSIS — G478 Other sleep disorders: Secondary | ICD-10-CM

## 2013-06-12 DIAGNOSIS — G2581 Restless legs syndrome: Secondary | ICD-10-CM

## 2013-06-12 DIAGNOSIS — G4761 Periodic limb movement disorder: Secondary | ICD-10-CM

## 2013-06-12 DIAGNOSIS — F513 Sleepwalking [somnambulism]: Secondary | ICD-10-CM

## 2013-06-12 DIAGNOSIS — G4733 Obstructive sleep apnea (adult) (pediatric): Secondary | ICD-10-CM

## 2013-06-12 DIAGNOSIS — G4710 Hypersomnia, unspecified: Secondary | ICD-10-CM

## 2013-06-21 ENCOUNTER — Telehealth: Payer: Self-pay | Admitting: Neurology

## 2013-06-21 DIAGNOSIS — G4733 Obstructive sleep apnea (adult) (pediatric): Secondary | ICD-10-CM

## 2013-06-21 NOTE — Telephone Encounter (Signed)
Please call and notify the patient that the recent sleep study did confirm the diagnosis of obstructive sleep apnea and that I recommend treatment for this in the form of CPAP. This will require a repeat sleep study for proper titration and mask fitting. We did not apply CPAP during this study, as he did not sleep well during the first part of the night and his sleep apnea did not manifest clearly until the later parts of the night.  Please explain to patient and arrange for a CPAP titration study. I have placed an order in the chart. Thanks, Huston Foley, MD, PhD [facility])

## 2013-06-24 ENCOUNTER — Encounter: Payer: Self-pay | Admitting: *Deleted

## 2013-06-24 NOTE — Telephone Encounter (Signed)
I called and spoke with the patient about his recent sleep study. Patient understood and all questions were answered. Patient would like to schedule his CPAP Titration study on a Saturday in November due to work related. I will call the patient back on Tuesday ( 06-25-13) after speaking [place]. I will mail results to the patient and Dr. Garfield Cornea.

## 2013-06-27 ENCOUNTER — Telehealth: Payer: Self-pay | Admitting: Neurology

## 2013-06-27 NOTE — Telephone Encounter (Signed)
I called and spoke with the patient about scheduling his sleep study. Patient stated his schedule has change and will callback on Monday (07-01-13) to schedule.

## 2013-07-05 ENCOUNTER — Ambulatory Visit (INDEPENDENT_AMBULATORY_CARE_PROVIDER_SITE_OTHER): Payer: 59

## 2013-07-05 DIAGNOSIS — G4733 Obstructive sleep apnea (adult) (pediatric): Secondary | ICD-10-CM

## 2013-07-05 DIAGNOSIS — G479 Sleep disorder, unspecified: Secondary | ICD-10-CM

## 2013-07-05 DIAGNOSIS — G4761 Periodic limb movement disorder: Secondary | ICD-10-CM

## 2013-07-05 DIAGNOSIS — G4710 Hypersomnia, unspecified: Secondary | ICD-10-CM

## 2013-07-08 ENCOUNTER — Telehealth: Payer: Self-pay | Admitting: Neurology

## 2013-07-08 NOTE — Telephone Encounter (Signed)
Pt had titration study done on 11/7- he is calling today asking for a rush request with ordering cpap.  His insurance will expire 11/14 and he wants to see if he can get cpap covered now.  I have indicated to the pt. that we will make every effort but cannot guarantee this to be completed by Friday, as we must take into account the dme selection and their processing time.

## 2013-07-08 NOTE — Telephone Encounter (Signed)
Please put study on top of the pile. Perhaps I can read tomorrow. s

## 2013-07-09 NOTE — Telephone Encounter (Signed)
Sh[place]nee, I was unable to locate this study within Dr. Ihor Austin.  Please advise if this is ready for her to read.

## 2013-07-09 NOTE — Telephone Encounter (Signed)
Report and packet ready for MD to read.  This was waiting to be finalized by acquiring tech. -sh

## 2013-07-10 ENCOUNTER — Telehealth: Payer: Self-pay | Admitting: Neurology

## 2013-07-10 DIAGNOSIS — G4733 Obstructive sleep apnea (adult) (pediatric): Secondary | ICD-10-CM

## 2013-07-10 NOTE — Telephone Encounter (Signed)
I called and left a message for the patient to callback to the office to speak with me Jasmine December) concerning his sleep study results.

## 2013-07-10 NOTE — Telephone Encounter (Signed)
FYI, this patient called and stated he gets new insurance, or his insurance deductible resets, or he loses his insurance as of 07/12/13.  If he has orders to process, we need to get them in STAT and let DME know about his status.  He called and explained to Kissa I understand.

## 2013-07-10 NOTE — Telephone Encounter (Signed)
Please call and inform patient that I have entered an order for treatment with PAP. We will arrange for a machine through a DME company and I will see the patient back in follow-up in about 6 weeks. I will be looking out for compliance data downloaded from the machine at the time of the followup appointment and discuss how it is going with PAP treatment at the time of the visit. Please also make sure, the patient has a follow-up appointment with me in 6 weeks from the setup date, thanks.  Huston Foley, MD, PhD [facility])

## 2013-07-11 ENCOUNTER — Other Ambulatory Visit: Payer: Self-pay | Admitting: Family Medicine

## 2013-07-15 ENCOUNTER — Encounter: Payer: Self-pay | Admitting: *Deleted

## 2013-09-06 ENCOUNTER — Encounter: Payer: Self-pay | Admitting: Neurology

## 2013-09-08 ENCOUNTER — Other Ambulatory Visit: Payer: Self-pay | Admitting: Family Medicine

## 2013-09-09 NOTE — Telephone Encounter (Signed)
Last office visit 04/11/2013 with Dr. Diona Browner.  Last Lipid 09/23/2010.  Ok to refill?

## 2013-09-09 NOTE — Telephone Encounter (Signed)
Ok, but he should f/u for physical this spring.

## 2013-09-11 NOTE — Progress Notes (Signed)
Quick Note:  I reviewed the patient's CPAP compliance data from 08/04/2013 to 09/02/2013, which is a total of 30 days, during which time the patient used CPAP only 15 nights. CPAP usage was a little better in the first two thirds of this compliance timeframe. The average usage for all days was therefore quite low at 3 hours and 9 minutes. The percent used days greater than 4 hours was only 40 %, indicating poor compliance. The residual AHI was 4.2 per hour, indicating an appropriate treatment pressure of 8 cwp with EPR of 2. I will review this data with the patient at the next office visit, provide feedback and additional troubleshooting if need be. I do not see a pending appointment on his chart. He needs to schedule an appointment. In addition, we should get in touch with the patient and his DME company, [facility], to inquire why there are so many lapses and treatment and what the obstacles to adherence to therapy may be.  Star Age, MD, PhD [facility] ([facility])   ______

## 2013-09-16 ENCOUNTER — Encounter: Payer: Self-pay | Admitting: Neurology

## 2013-09-18 ENCOUNTER — Telehealth: Payer: Self-pay | Admitting: *Deleted

## 2013-09-18 NOTE — Telephone Encounter (Signed)
Received prior auth request from pharmacy on Crestor. We need additional information from pt on whether or not he has taken any other chol meds. I called, and left message on cell for pt to return call to office.

## 2013-09-23 NOTE — Telephone Encounter (Signed)
Pt left vm requesting status of prior auth.  He says he has not heard from our office in over a week and is completely out of Crestor.  Cb [id number].

## 2013-10-07 ENCOUNTER — Telehealth: Payer: Self-pay | Admitting: Neurology

## 2013-10-07 ENCOUNTER — Encounter: Payer: Self-pay | Admitting: Neurology

## 2013-10-07 NOTE — Telephone Encounter (Signed)
Pt returning call from message left requesting he schedule his post CPAP follow up appt.  Pt advises that with his new insurance coverage, [facility] is no longer in network.  He will be transferring to a different provider.

## 2013-12-29 IMAGING — CR DG LUMBAR SPINE COMPLETE 4+V
5 series · 5 of 5 positions shown · non-contrast
Comparison: CT 01/04/2012.

CLINICAL DATA: Back pain and radiculopathy.

LUMBAR SPINE - COMPLETE 4+ VIEW

[view not recorded (1 of 5)]
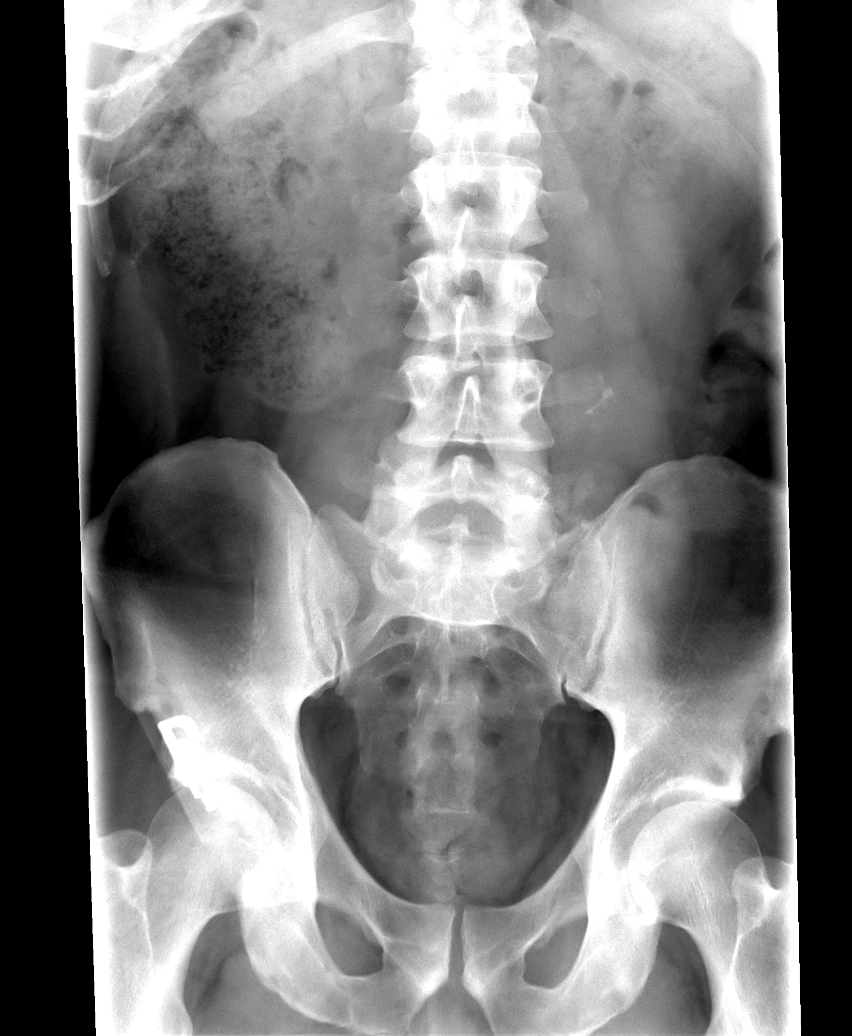

[view not recorded (2 of 5)]
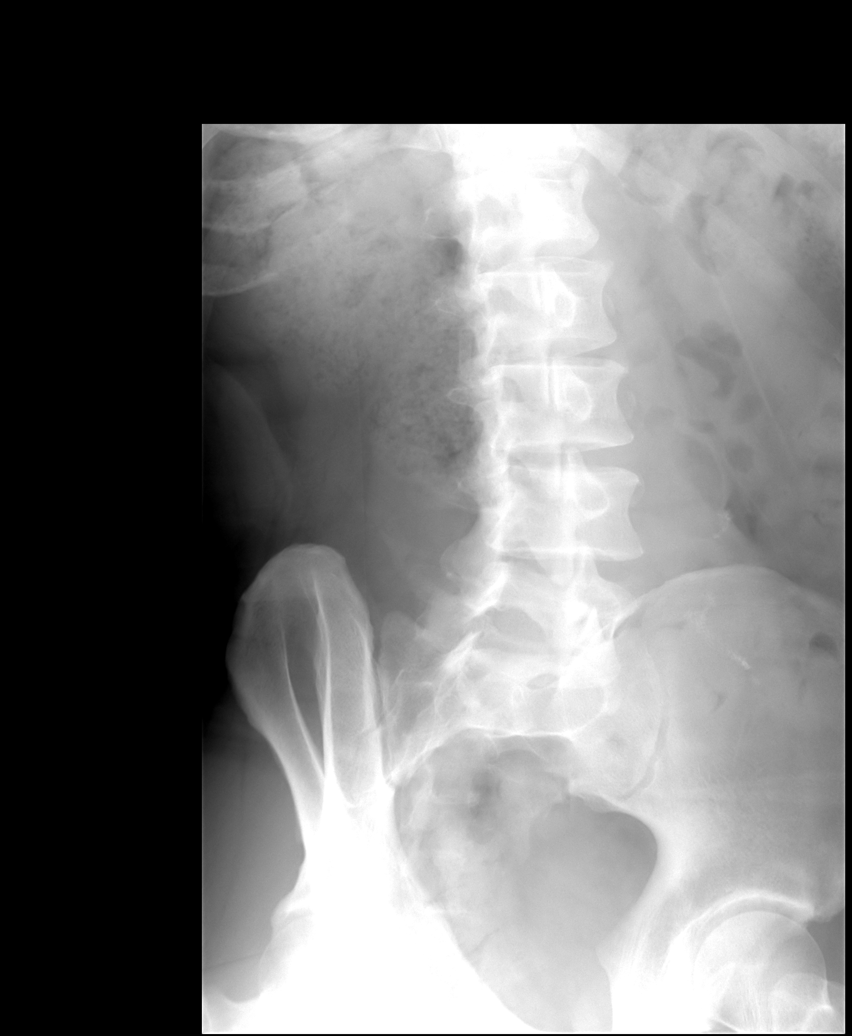

[view not recorded (3 of 5)]
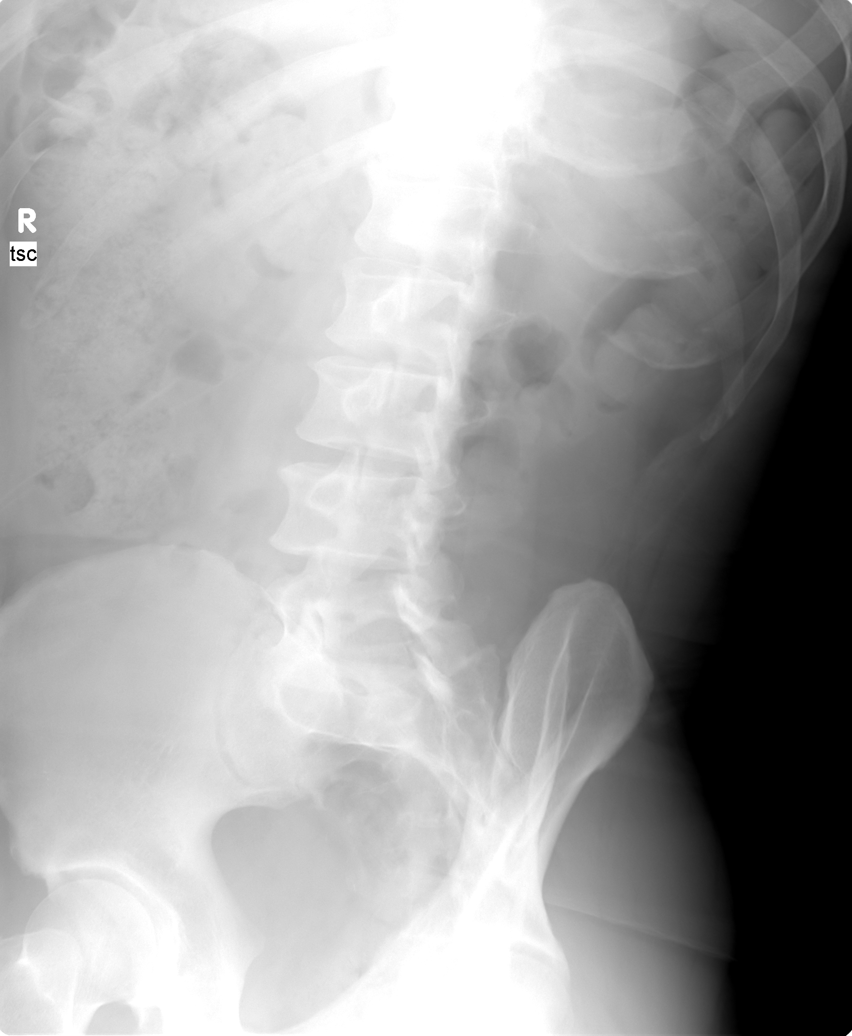

[view not recorded (4 of 5)]
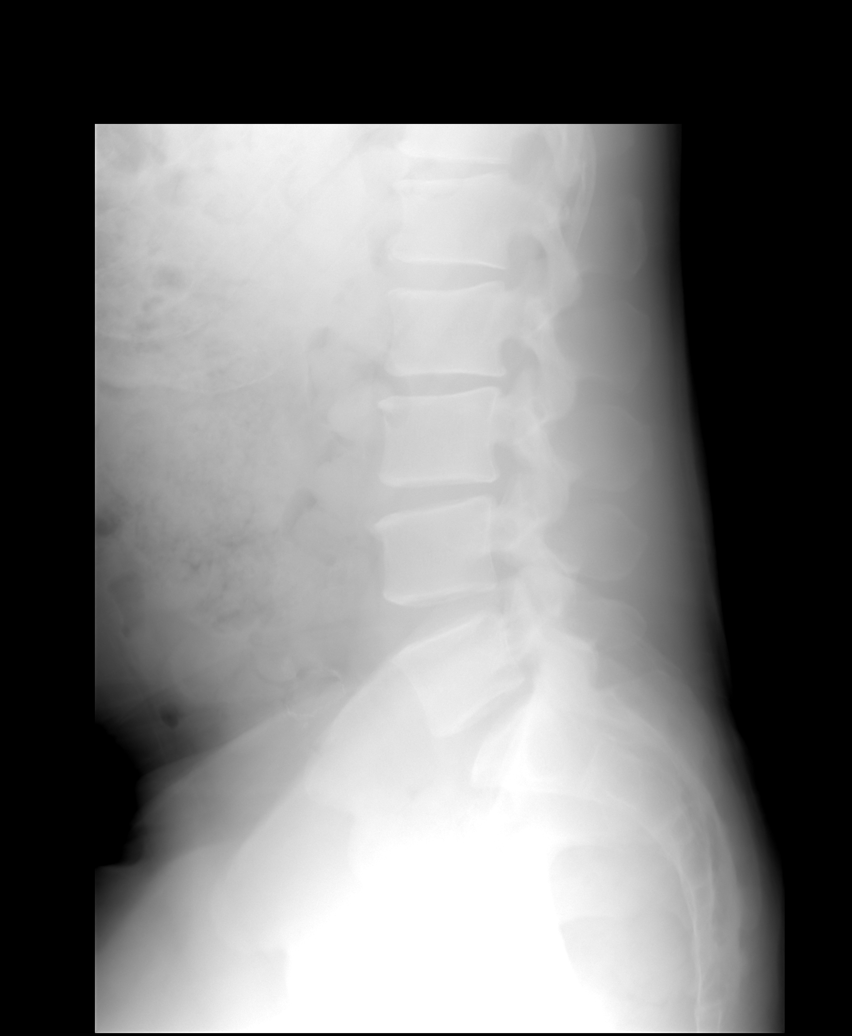

[view not recorded (5 of 5)]
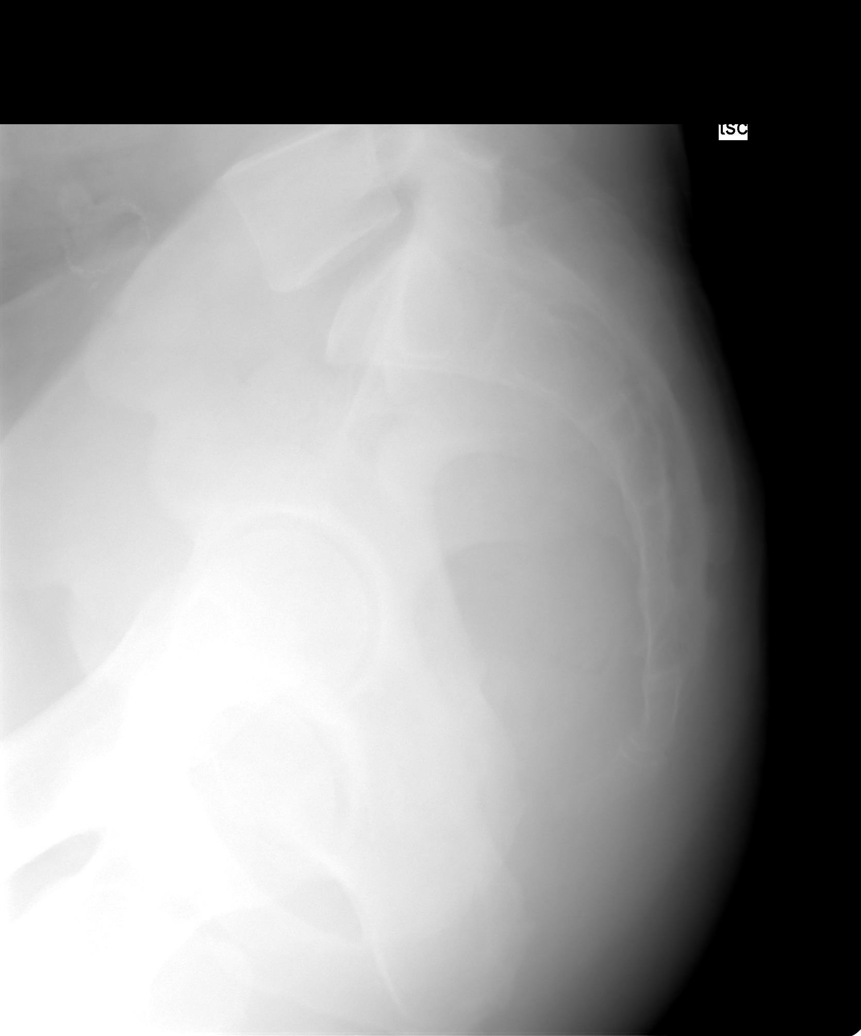

[5 of 5 positions shown; findings below may reference images not displayed]

FINDINGS: Exam demonstrates normal vertebral body alignment.  There
is mild spondylosis throughout the lumbar spine.  There is mild
disc space narrowing at the L2-3 and L3-4 levels.  Facet
arthropathy is present over the lower lumbar spine.  There is
subtle loss of anterior vertebral body height of T12 without
significant change.  There is subtle loss of anterior vertebral
body height of L1 not well seen on the prior CT scan.  Remainder of
the exam is unremarkable.
IMPRESSION: Mild spondylosis throughout the lumbar spine with degenerative disc
disease at the L2-3 and L3-4 levels.

Stable mild loss of anterior vertebral body height of T12.  Mild
loss of anterior vertebral body height of L1 not well seen on the
prior exam.  MRI may be helpful to exclude acute/evolving
compression fracture.

## 2014-01-10 ENCOUNTER — Other Ambulatory Visit: Payer: Self-pay | Admitting: Family Medicine

## 2014-02-17 ENCOUNTER — Other Ambulatory Visit: Payer: Self-pay | Admitting: Family Medicine

## 2014-02-17 NOTE — Telephone Encounter (Signed)
Ref 30, 3 ref  F/u cpx

## 2014-02-17 NOTE — Telephone Encounter (Signed)
Last office visit 04/01/2013 with Dr. Diona Browner.  No future appointments scheduled.  Ok to refill?

## 2014-05-21 ENCOUNTER — Other Ambulatory Visit: Payer: Self-pay | Admitting: Family Medicine

## 2014-06-16 ENCOUNTER — Other Ambulatory Visit: Payer: Self-pay | Admitting: Family Medicine

## 2014-06-17 ENCOUNTER — Telehealth: Payer: Self-pay | Admitting: Family Medicine

## 2014-06-17 NOTE — Telephone Encounter (Signed)
Left message asking pt to call office please schedule appointment   Please schedule CPE with fasting labs prior with Dr. Lorelei Pont.  Thanks,  Butch Penny

## 2014-06-24 NOTE — Telephone Encounter (Signed)
Left message asking pt to call office  °

## 2014-06-26 NOTE — Telephone Encounter (Signed)
Left message asking pt to call office  °

## 2014-07-01 ENCOUNTER — Encounter: Payer: Self-pay | Admitting: Family Medicine

## 2014-07-01 NOTE — Telephone Encounter (Signed)
Spoke with pt  Pt stated his insurance changed and he is seeing another dr now

## 2014-07-01 NOTE — Telephone Encounter (Signed)
Mailed pt letter

## 2014-07-01 NOTE — Telephone Encounter (Signed)
Pt left v/m returning multiple calls and request cb at [phone].

## 2014-07-09 ENCOUNTER — Other Ambulatory Visit: Payer: Self-pay | Admitting: Family Medicine

## 2014-07-13 ENCOUNTER — Other Ambulatory Visit: Payer: Self-pay | Admitting: Family Medicine

## 2014-10-22 IMAGING — US US ABDOMEN COMPLETE
1 series · 13 of 25 positions shown · non-contrast
Comparison: CT abdomen and pelvis January 04, 2012

CLINICAL DATA: Epigastric region pain

EXAM:
ABDOMEN ULTRASOUND

[Series 1: us abdomen complete · 0.39mm/px · 13 of 86 slices shown]
[im 1/86]
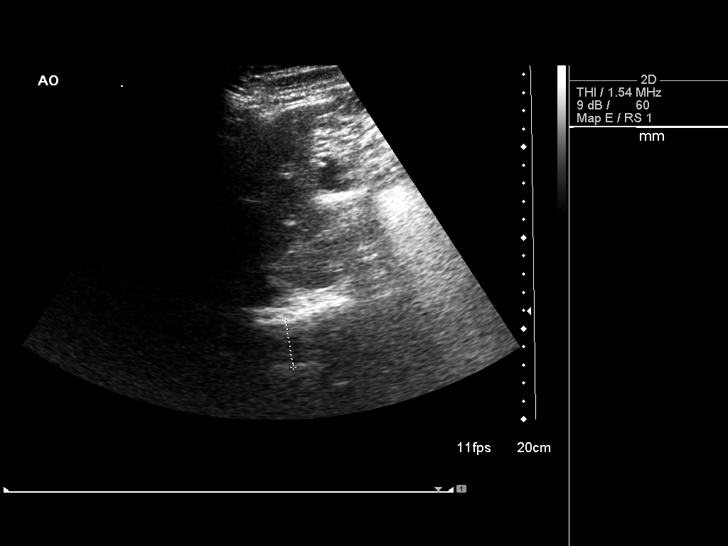
[im 8/86]
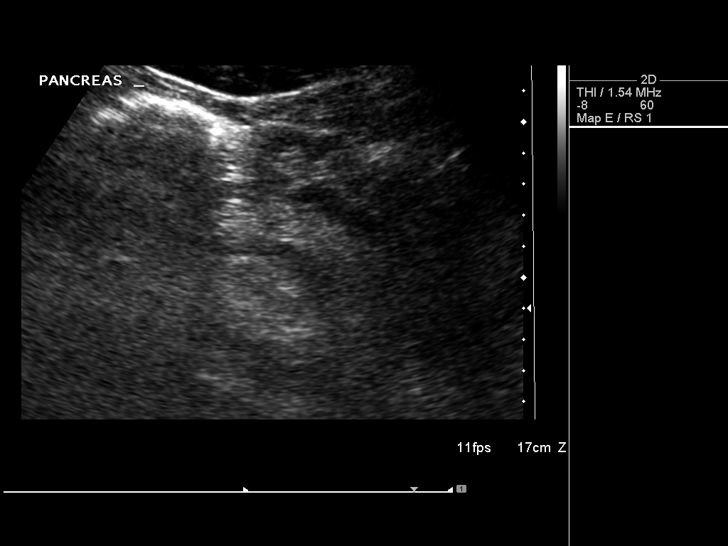
[im 15/86]
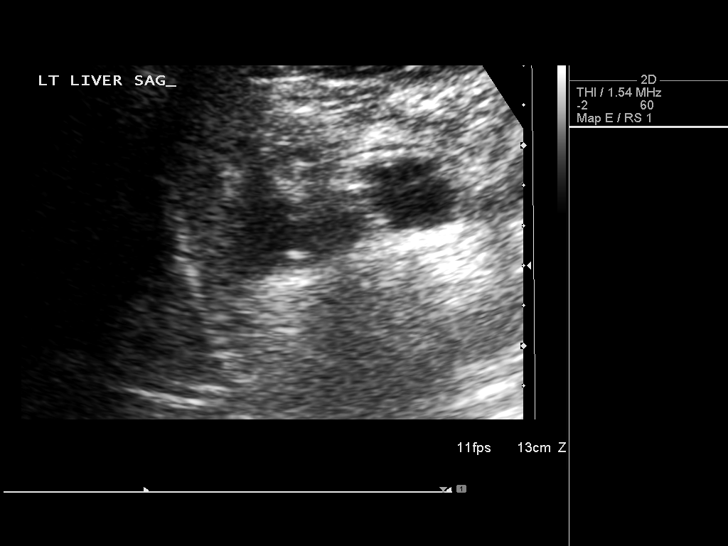
[im 22/86]
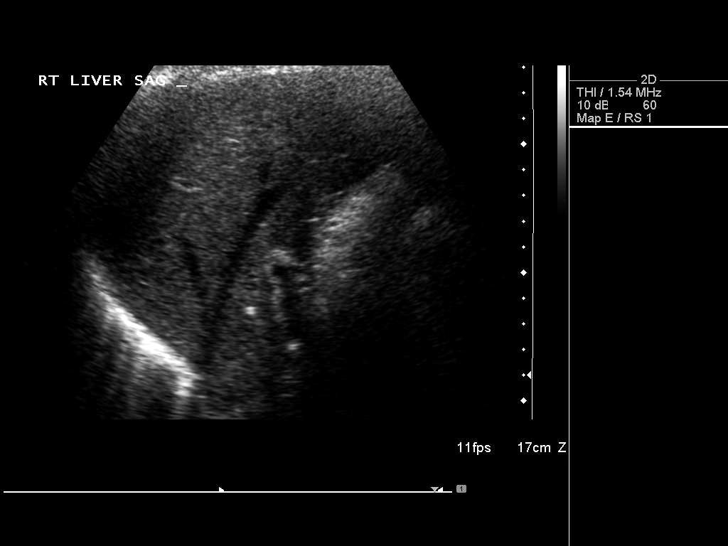
[im 29/86]
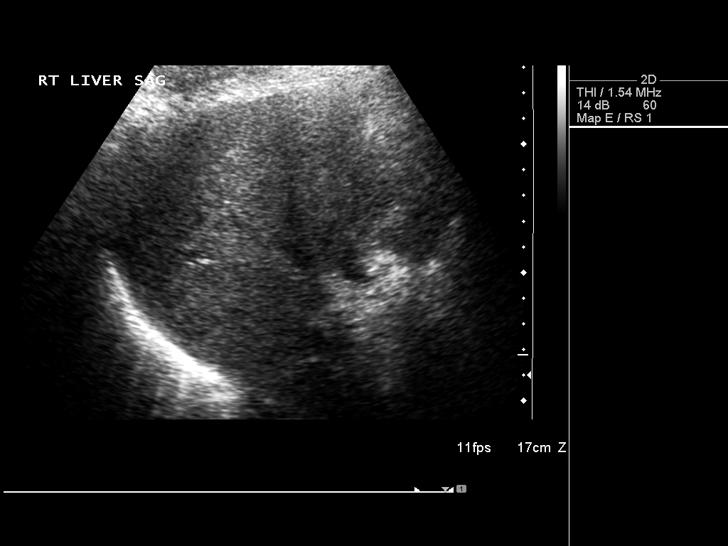
[im 36/86]
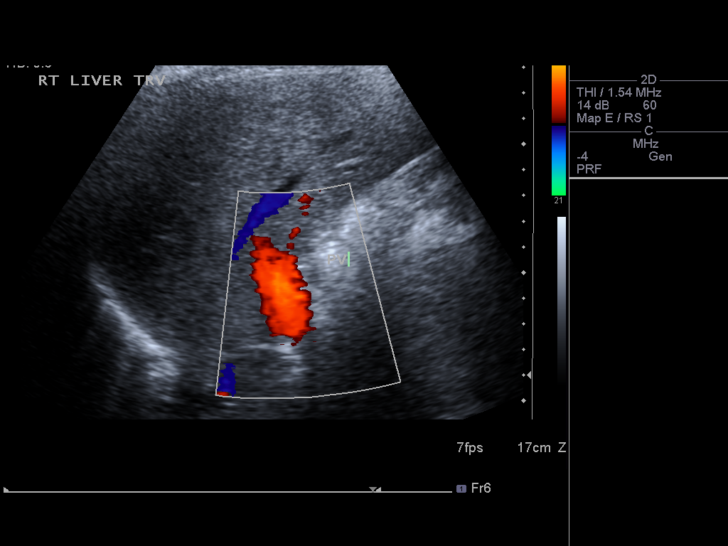
[im 43/86]
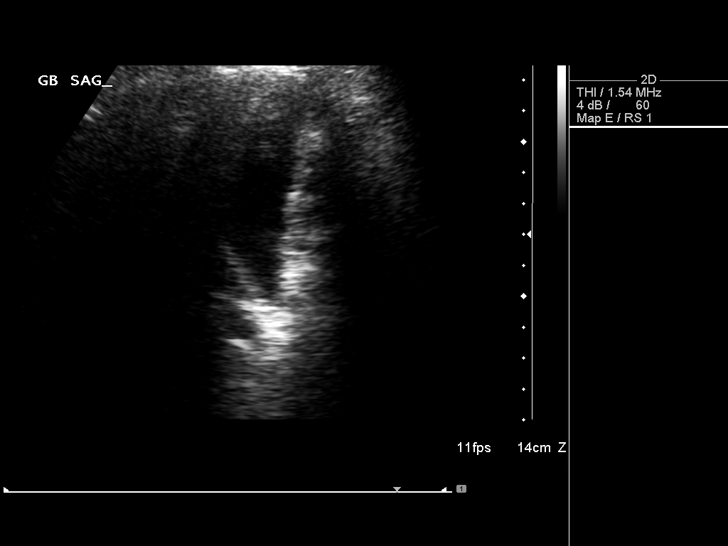
[im 50/86]
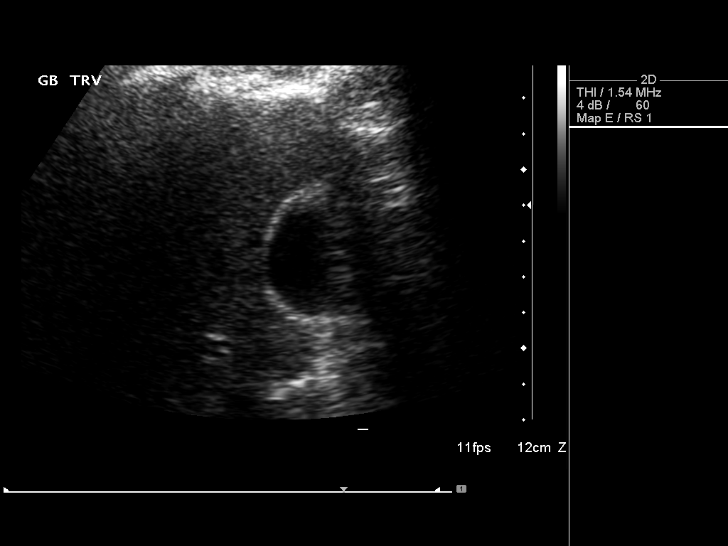
[im 57/86]
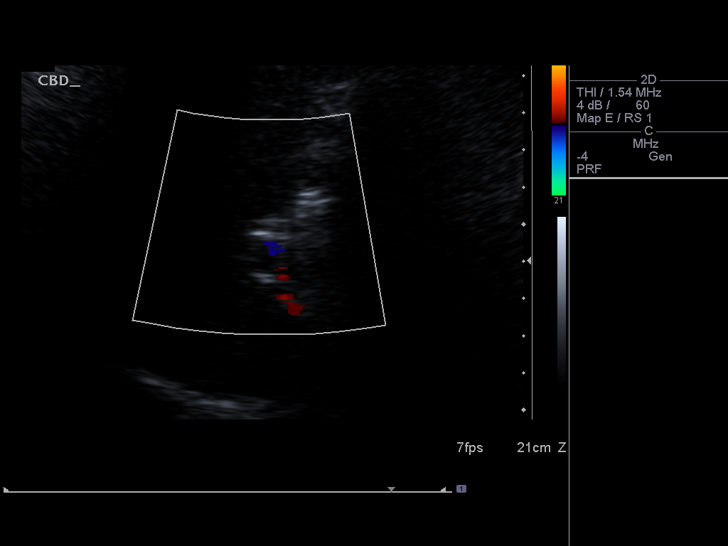
[im 64/86]
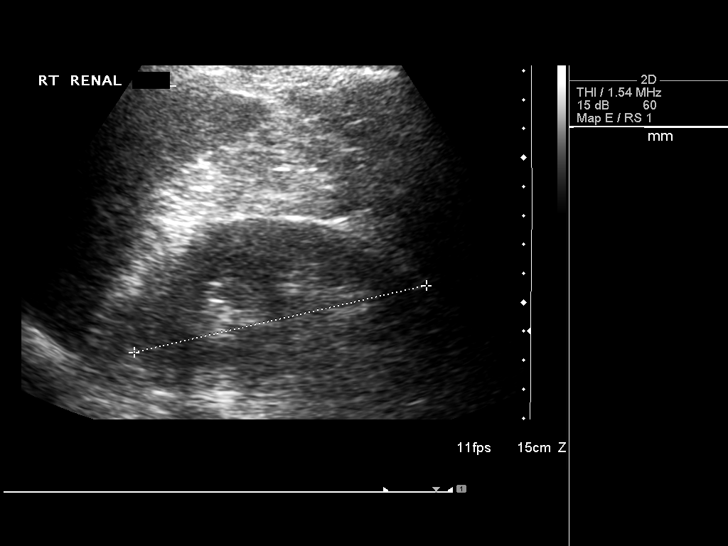
[im 71/86]
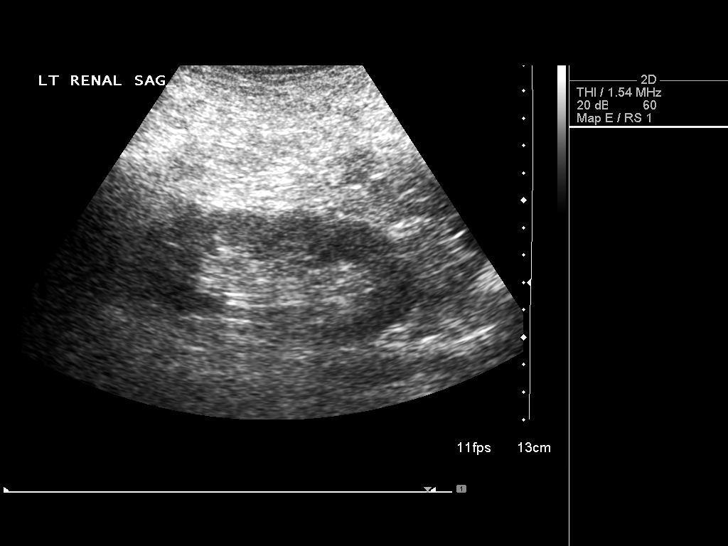
[im 78/86]
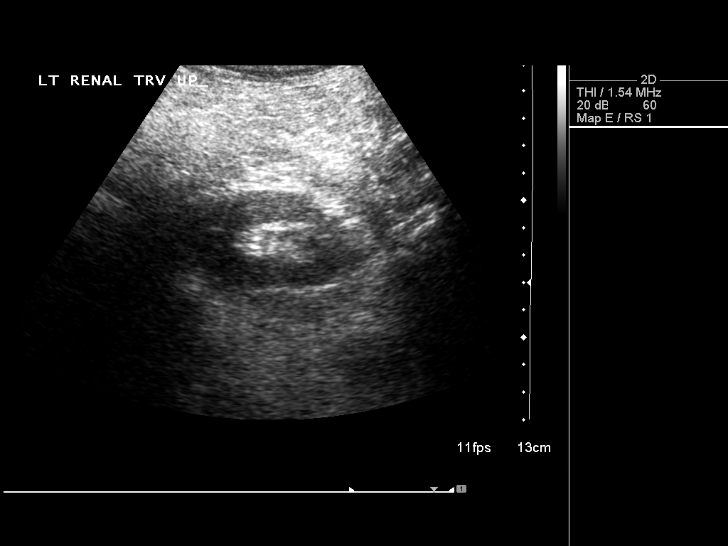
[im 86/86]
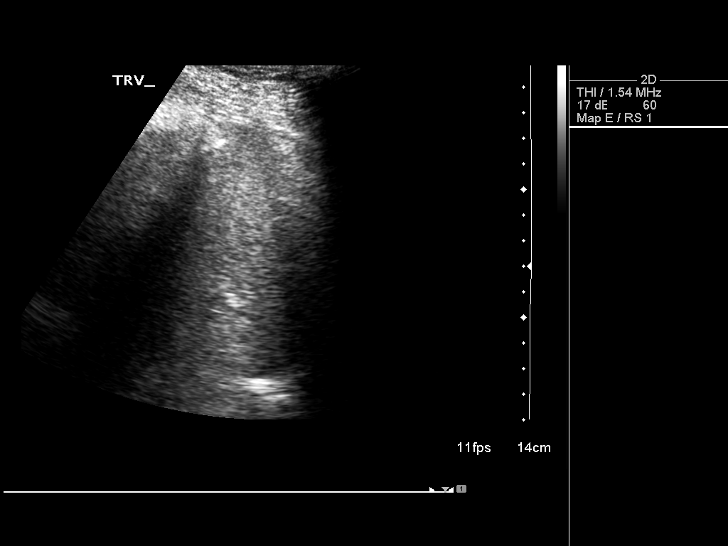

[13 of 25 positions shown; findings below may reference images not displayed]

FINDINGS: Gallbladder

No gallstones or wall thickening. There is no pericholecystic fluid.
Negative sonographic Murphy's sign.

Common bile duct

Diameter: 3.5 mm. There is no intrahepatic, common hepatic, or
common bile duct dilatation.

Liver

There is an echogenic mass in the medial segment of the left lobe of
the liver measuring 1.4 x 2.0 x 1.3 cm, increased in size from prior
CT examination. There is underlying fatty change in the liver.

IVC

No abnormality visualized.

Pancreas

No pancreatic lesions identified.

Spleen

Size and appearance within normal limits.

Right Kidney

Length: 10.5 cm Echogenicity within normal limits. No mass or
hydronephrosis visualized.

Left Kidney

Length: 10.9 cm. Echogenicity within normal limits. No mass or
hydronephrosis visualized.

Abdominal aorta

No aneurysm visualized.
IMPRESSION: There is a 1.4 x 2.0 x 1.3 cm echogenic focus in the left lobe of
the liver. This structure has increased in size compared to prior
study. Given this circumstance, correlation with MR of the liver
with and without intravenous contrast would be advised to further
assess.

There is fatty change elsewhere in the liver. It should be noted
that the sensitivity of ultrasound for more subtle liver lesions is
diminished given underlying fatty change.

Study otherwise unremarkable. j

## 2015-07-27 ENCOUNTER — Other Ambulatory Visit: Payer: Self-pay | Admitting: Family Medicine

## 2015-07-27 DIAGNOSIS — G43909 Migraine, unspecified, not intractable, without status migrainosus: Secondary | ICD-10-CM

## 2015-07-27 DIAGNOSIS — R200 Anesthesia of skin: Secondary | ICD-10-CM

## 2015-07-27 DIAGNOSIS — R202 Paresthesia of skin: Secondary | ICD-10-CM

## 2015-07-27 DIAGNOSIS — R519 Headache, unspecified: Secondary | ICD-10-CM

## 2015-07-27 DIAGNOSIS — R51 Headache: Secondary | ICD-10-CM

## 2015-08-13 ENCOUNTER — Ambulatory Visit
Admission: RE | Admit: 2015-08-13 | Discharge: 2015-08-13 | Disposition: A | Discharge status: Home / Self Care | Payer: BLUE CROSS/BLUE SHIELD | Source: Ambulatory Visit | Attending: Family Medicine | Admitting: Family Medicine

## 2015-08-13 DIAGNOSIS — R519 Headache, unspecified: Secondary | ICD-10-CM

## 2015-08-13 DIAGNOSIS — R202 Paresthesia of skin: Secondary | ICD-10-CM | POA: Diagnosis not present

## 2015-08-13 DIAGNOSIS — M542 Cervicalgia: Secondary | ICD-10-CM | POA: Insufficient documentation

## 2015-08-13 DIAGNOSIS — R200 Anesthesia of skin: Secondary | ICD-10-CM | POA: Diagnosis not present

## 2015-08-13 DIAGNOSIS — R51 Headache: Secondary | ICD-10-CM | POA: Diagnosis not present

## 2015-08-13 DIAGNOSIS — G43909 Migraine, unspecified, not intractable, without status migrainosus: Secondary | ICD-10-CM | POA: Diagnosis not present

## 2016-03-21 ENCOUNTER — Encounter: Payer: Self-pay | Admitting: Gastroenterology
